# Patient Record
Sex: Male | Born: 1975
Health system: Southern US, Community
[De-identification: ages and names within clinical notes are randomized; demographics above are authoritative.]

## PROBLEM LIST (undated history)

## (undated) DIAGNOSIS — F419 Anxiety disorder, unspecified: Secondary | ICD-10-CM

## (undated) DIAGNOSIS — G5600 Carpal tunnel syndrome, unspecified upper limb: Secondary | ICD-10-CM

## (undated) DIAGNOSIS — F32A Depression, unspecified: Secondary | ICD-10-CM

## (undated) DIAGNOSIS — E785 Hyperlipidemia, unspecified: Secondary | ICD-10-CM

## (undated) DIAGNOSIS — G709 Myoneural disorder, unspecified: Secondary | ICD-10-CM

## (undated) DIAGNOSIS — N189 Chronic kidney disease, unspecified: Secondary | ICD-10-CM

## (undated) DIAGNOSIS — T7840XA Allergy, unspecified, initial encounter: Secondary | ICD-10-CM

## (undated) DIAGNOSIS — F329 Major depressive disorder, single episode, unspecified: Secondary | ICD-10-CM

## (undated) DIAGNOSIS — G629 Polyneuropathy, unspecified: Secondary | ICD-10-CM

## (undated) DIAGNOSIS — I8392 Asymptomatic varicose veins of left lower extremity: Secondary | ICD-10-CM

## (undated) HISTORY — DX: Anxiety disorder, unspecified: F41.9

## (undated) HISTORY — DX: Polyneuropathy, unspecified: G62.9

## (undated) HISTORY — DX: Myoneural disorder, unspecified: G70.9

## (undated) HISTORY — DX: Carpal tunnel syndrome, unspecified upper limb: G56.00

## (undated) HISTORY — DX: Hyperlipidemia, unspecified: E78.5

## (undated) HISTORY — DX: Asymptomatic varicose veins of left lower extremity: I83.92

## (undated) HISTORY — DX: Depression, unspecified: F32.A

## (undated) HISTORY — DX: Allergy, unspecified, initial encounter: T78.40XA

## (undated) HISTORY — DX: Major depressive disorder, single episode, unspecified: F32.9

---

## 1994-11-20 HISTORY — PX: LUMBAR DISC SURGERY: SHX700

## 1994-11-20 HISTORY — PX: BACK SURGERY: SHX140

## 1998-12-28 ENCOUNTER — Encounter: Payer: Self-pay | Admitting: Emergency Medicine

## 1998-12-28 ENCOUNTER — Emergency Department (HOSPITAL_COMMUNITY): Admission: EM | Admit: 1998-12-28 | Discharge: 1998-12-28 | Payer: Self-pay | Admitting: Emergency Medicine

## 2003-05-28 ENCOUNTER — Encounter: Payer: Self-pay | Admitting: Orthopedic Surgery

## 2003-05-28 ENCOUNTER — Ambulatory Visit (HOSPITAL_COMMUNITY): Admission: RE | Admit: 2003-05-28 | Discharge: 2003-05-28 | Payer: Self-pay | Admitting: Orthopedic Surgery

## 2012-06-25 ENCOUNTER — Ambulatory Visit (INDEPENDENT_AMBULATORY_CARE_PROVIDER_SITE_OTHER): Payer: PRIVATE HEALTH INSURANCE | Admitting: Family Medicine

## 2012-06-25 VITALS — BP 102/66 | HR 67 | Temp 98.0°F | Resp 16 | Ht 77.0 in | Wt 218.0 lb

## 2012-06-25 DIAGNOSIS — R079 Chest pain, unspecified: Secondary | ICD-10-CM

## 2012-06-25 NOTE — Patient Instructions (Addendum)
Chest Pain (Nonspecific) It is often hard to give a specific diagnosis for the cause of chest pain. There is always a chance that your pain could be related to something serious, such as a heart attack or a blood clot in the lungs. You need to follow up with your caregiver for further evaluation. CAUSES   Heartburn.   Pneumonia or bronchitis.   Anxiety or stress.   Inflammation around your heart (pericarditis) or lung (pleuritis or pleurisy).   A blood clot in the lung.   A collapsed lung (pneumothorax). It can develop suddenly on its own (spontaneous pneumothorax) or from injury (trauma) to the chest.   Shingles infection (herpes zoster virus).  The chest wall is composed of bones, muscles, and cartilage. Any of these can be the source of the pain.  The bones can be bruised by injury.   The muscles or cartilage can be strained by coughing or overwork.   The cartilage can be affected by inflammation and become sore (costochondritis).  DIAGNOSIS  Lab tests or other studies, such as X-rays, electrocardiography, stress testing, or cardiac imaging, may be needed to find the cause of your pain.  TREATMENT   Treatment depends on what may be causing your chest pain. Treatment may include:   Acid blockers for heartburn.   Anti-inflammatory medicine.   Pain medicine for inflammatory conditions.   Antibiotics if an infection is present.   You may be advised to change lifestyle habits. This includes stopping smoking and avoiding alcohol, caffeine, and chocolate.   You may be advised to keep your head raised (elevated) when sleeping. This reduces the chance of acid going backward from your stomach into your esophagus.   Most of the time, nonspecific chest pain will improve within 2 to 3 days with rest and mild pain medicine.  HOME CARE INSTRUCTIONS   If antibiotics were prescribed, take your antibiotics as directed. Finish them even if you start to feel better.   For the next few  days, avoid physical activities that bring on chest pain. Continue physical activities as directed.   Do not smoke.   Avoid drinking alcohol.   Only take over-the-counter or prescription medicine for pain, discomfort, or fever as directed by your caregiver.   Follow your caregiver's suggestions for further testing if your chest pain does not go away.   Keep any follow-up appointments you made. If you do not go to an appointment, you could develop lasting (chronic) problems with pain. If there is any problem keeping an appointment, you must call to reschedule.  SEEK MEDICAL CARE IF:   You think you are having problems from the medicine you are taking. Read your medicine instructions carefully.   Your chest pain does not go away, even after treatment.   You develop a rash with blisters on your chest.  SEEK IMMEDIATE MEDICAL CARE IF:   You have increased chest pain or pain that spreads to your arm, neck, jaw, back, or abdomen.   You develop shortness of breath, an increasing cough, or you are coughing up blood.   You have severe back or abdominal pain, feel nauseous, or vomit.   You develop severe weakness, fainting, or chills.   You have a fever.  THIS IS AN EMERGENCY. Do not wait to see if the pain will go away. Get medical help at once. Call your local emergency services (911 in [place] Do not drive yourself to the hospital. MAKE SURE YOU:   Understand these instructions.     Will watch your condition.   Will get help right away if you are not doing well or get worse.  Document Released: 08/16/2005 Document Revised: 10/26/2011 Document Reviewed: 06/11/2008 ExitCare Patient Information 2012 [employer]. 

## 2012-06-25 NOTE — Progress Notes (Signed)
  Subjective:    Patient ID: Mark Morton, male    DOB: 04-Apr-1976, 36 y.o.   MRN: [id number]  HPI Comments: While bending over drilling screws into a piece of wood he developed chest tightness radiating "all the way across" his chest.  He stood up and within 1 minute it resolved. It was significant enough to cause him to stop for a while.  He has not had CP prior to or since that episode.  No SOB, nausea or reflux.  No coughing.  No FMHX of CVD.  Episode occurred before lunch.    Chest Pain  Pertinent negatives include no dizziness, palpitations or shortness of breath.      Review of Systems  Constitutional: Negative.   Respiratory: Positive for chest tightness. Negative for shortness of breath.   Cardiovascular: Positive for chest pain. Negative for palpitations.  Musculoskeletal: Negative.   Neurological: Negative.  Negative for dizziness.       Objective:   Physical Exam  Constitutional: He is oriented to person, place, and time. He appears well-developed and well-nourished.  HENT:  Head: Normocephalic.  Cardiovascular: Normal rate, regular rhythm and intact distal pulses.  Exam reveals no gallop and no friction rub.   No murmur heard. Pulmonary/Chest: Effort normal and breath sounds normal. He has no wheezes. He has no rales.       Very mild chest wall TTP in left upper pectoralis region.  Neurological: He is alert and oriented to person, place, and time. He exhibits normal muscle tone. Coordination normal.  Skin: Skin is warm and dry.  Psychiatric: He has a normal mood and affect. His behavior is normal. Judgment and thought content normal.    ECG:      Assessment & Plan:  Atypical Chest Pain  Suspect atypical muscle spasm  Reassurrance given  F/U prn  Recommend CPE at some point in near future.

## 2012-10-23 ENCOUNTER — Ambulatory Visit (INDEPENDENT_AMBULATORY_CARE_PROVIDER_SITE_OTHER): Payer: PRIVATE HEALTH INSURANCE | Admitting: Family Medicine

## 2012-10-23 ENCOUNTER — Ambulatory Visit: Payer: PRIVATE HEALTH INSURANCE

## 2012-10-23 VITALS — BP 122/74 | HR 70 | Temp 97.4°F | Resp 18 | Ht 76.5 in | Wt 219.0 lb

## 2012-10-23 DIAGNOSIS — Z0000 Encounter for general adult medical examination without abnormal findings: Secondary | ICD-10-CM

## 2012-10-23 DIAGNOSIS — M7712 Lateral epicondylitis, left elbow: Secondary | ICD-10-CM

## 2012-10-23 DIAGNOSIS — M25572 Pain in left ankle and joints of left foot: Secondary | ICD-10-CM

## 2012-10-23 DIAGNOSIS — M25579 Pain in unspecified ankle and joints of unspecified foot: Secondary | ICD-10-CM

## 2012-10-23 DIAGNOSIS — M7710 Lateral epicondylitis, unspecified elbow: Secondary | ICD-10-CM

## 2012-10-23 LAB — POCT CBC
Granulocyte percent: 57 %G (ref 37–80)
HCT, POC: 47.4 % (ref 43.5–53.7)
Hemoglobin: 15.3 g/dL (ref 14.1–18.1)
Lymph, poc: 2.4 (ref 0.6–3.4)
MCH, POC: 31.6 pg — AB (ref 27–31.2)
MCHC: 32.3 g/dL (ref 31.8–35.4)
MCV: 97.9 fL — AB (ref 80–97)
MID (cbc): 0.4 (ref 0–0.9)
MPV: 10 fL (ref 0–99.8)
POC Granulocyte: 3.8 (ref 2–6.9)
POC LYMPH PERCENT: 36.8 %L (ref 10–50)
POC MID %: 6.2 %M (ref 0–12)
Platelet Count, POC: 291 10*3/uL (ref 142–424)
RBC: 4.84 M/uL (ref 4.69–6.13)
RDW, POC: 13.3 %
WBC: 6.6 10*3/uL (ref 4.6–10.2)

## 2012-10-23 LAB — POCT UA - MICROSCOPIC ONLY
Bacteria, U Microscopic: NEGATIVE
Casts, Ur, LPF, POC: NEGATIVE
Crystals, Ur, HPF, POC: NEGATIVE
Epithelial cells, urine per micros: NEGATIVE
Mucus, UA: NEGATIVE
RBC, urine, microscopic: NEGATIVE
WBC, Ur, HPF, POC: NEGATIVE
Yeast, UA: NEGATIVE

## 2012-10-23 LAB — POCT URINALYSIS DIPSTICK
Bilirubin, UA: NEGATIVE
Blood, UA: NEGATIVE
Glucose, UA: NEGATIVE
Ketones, UA: NEGATIVE
Leukocytes, UA: NEGATIVE
Nitrite, UA: NEGATIVE
Protein, UA: NEGATIVE
Spec Grav, UA: 1.025
Urobilinogen, UA: 0.2
pH, UA: 5.5

## 2012-10-23 MED ORDER — PREDNISONE 20 MG PO TABS: 40.0000 mg | ORAL_TABLET | Freq: Every day | ORAL | Status: DC

## 2012-10-23 NOTE — Progress Notes (Signed)
@[email or web address] @  Patient ID: Mark Morton MRN: [id number], DOB: 1976/03/10 36 y.o. Date of Encounter: 10/23/2012, 9:00 PM  Primary Physician: No primary provider on file.  Chief Complaint: Physical (CPE)  HPI: 36 y.o. y/o male with history noted below here for CPE.  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 He has several complaints: 1 left arm pain for 2 weeks over the brachial radialis. This began after playing vigorously on the car for extended length of time for his church choir. 2 patient has a number of moles on his back his girlfriend told him he needs that looked at 3 patient has 2 years of left ankle pain following a basketball game. He stayed off basketball games for 2 years but over the last 6 weeks he started playing again and he notices acute sharp pain intermittently when he is running back and forth  Review of Systems: Consitutional: No fever, chills, fatigue, night sweats, lymphadenopathy, or weight changes. Eyes: No visual changes, eye redness, or discharge. ENT/Mouth: Ears: No otalgia, tinnitus, hearing loss, discharge. Nose: No congestion, rhinorrhea, sinus pain, or epistaxis. Throat: No sore throat, post nasal drip, or teeth pain. Cardiovascular: No CP, palpitations, diaphoresis, DOE, edema, orthopnea, PND. Respiratory: No cough, hemoptysis, SOB, or wheezing. Gastrointestinal: No anorexia, dysphagia, reflux, pain, nausea, vomiting, hematemesis, diarrhea, constipation, BRBPR, or melena. Genitourinary: No dysuria, frequency, urgency, hematuria, incontinence, nocturia, decreased urinary stream, discharge, impotence, or testicular pain/masses. Musculoskeletal: No decreased ROM, myalgias, stiffness, joint swelling, or weakness. Skin: No rash, erythema, lesion changes, pain, warmth, jaundice, or pruritis. Neurological: No headache, dizziness, syncope, seizures, tremors, memory loss, coordination problems, or paresthesias. Psychological: No anxiety, depression, hallucinations,  SI/HI. Endocrine: No fatigue, polydipsia, polyphagia, polyuria, or known diabetes. All other systems were reviewed and are otherwise negative.  Past Medical History  Diagnosis Date  . Allergy   . Anxiety   . Depression      History reviewed. No pertinent past surgical history.  Home Meds:  Prior to Admission medications   Not on File    Allergies: No Known Allergies  History   Social History  . Marital Status: Single    Spouse Name: N/A    Number of Children: N/A  . Years of Education: N/A   Occupational History  . Not on file.   Social History Main Topics  . Smoking status: Never Smoker   . Smokeless tobacco: Not on file  . Alcohol Use: 2.4 oz/week    4 Cans of beer per week  . Drug Use: No  . Sexually Active: No   Other Topics Concern  . Not on file   Social History Narrative  . No narrative on file    Family History  Problem Relation Age of Onset  . Cancer Maternal Grandmother   . Diabetes Maternal Grandfather     Physical Exam: Blood pressure 122/74, pulse 70, temperature 97.4 F (36.3 C), resp. rate 18, height 6' 4.5" (1.943 m), weight 219 lb (99.338 kg), SpO2 97.00%.  General: Well developed, well nourished, in no acute distress. HEENT: Normocephalic, atraumatic. Conjunctiva pink, sclera non-icteric. Pupils 2 mm constricting to 1 mm, round, regular, and equally reactive to light and accomodation. EOMI. Internal auditory canal clear. TMs with good cone of light and without pathology. Nasal mucosa pink. Nares are without discharge. No sinus tenderness. Oral mucosa pink. Dentition mild gingivitis. Pharynx without exudate.   Neck: Supple. Trachea midline. No thyromegaly. Full ROM. No lymphadenopathy. Lungs: Clear to auscultation bilaterally without wheezes, rales, or  rhonchi. Breathing is of normal effort and unlabored. Cardiovascular: RRR with S1 S2. No murmurs, rubs, or gallops appreciated. Distal pulses 2+ symmetrically. No carotid or abdominal  bruits Abdomen: Soft, non-tender, non-distended with normoactive bowel sounds. No hepatosplenomegaly or masses. No rebound/guarding. No CVA tenderness. Without hernias.  Rectal: No external hemorrhoids or fissures.   Genitourinary:  uncircumcised male. No penile lesions. Testes descended bilaterally, and smooth without tenderness or masses.  Musculoskeletal: Full range of motion and 5/5 strength throughout. Without swelling, atrophy, tenderness, crepitus, or warmth. Extremities without clubbing, cyanosis, or edema. Calves supple. Examination of the left brachial radialis reveals mild tenderness, no swelling, pain with full extension. He has normal hand and wrist movement and negative Tinel's Examination of left ankle reveals normal range of motion, normal palpation, normal inspection, normal ligamentous testing including anterior drawer. Skin: Warm and moist without erythema, ecchymosis, wounds, or rash. Dermlite exam reveals multiple seborrheic keratosis on his back Neuro: A+Ox3. CN II-XII grossly intact. Moves all extremities spontaneously. Full sensation throughout. Normal gait. DTR 2+ throughout upper and lower extremities. Finger to nose intact. Psych:  Responds to questions appropriately with a normal affect.   [facility] reading (PRIMARY) by  Dr. Milus Glazier:  Moderate calcific changes in medial deltoid ligament of left ankle Results for orders placed in visit on 10/23/12  POCT CBC      Component Value Range   WBC 6.6  4.6 - 10.2 K/uL   Lymph, poc 2.4  0.6 - 3.4   POC LYMPH PERCENT 36.8  10 - 50 %L   MID (cbc) 0.4  0 - 0.9   POC MID % 6.2  0 - 12 %M   POC Granulocyte 3.8  2 - 6.9   Granulocyte percent 57.0  37 - 80 %G   RBC 4.84  4.69 - 6.13 M/uL   Hemoglobin 15.3  14.1 - 18.1 g/dL   HCT, POC 16.1  09.6 - 53.7 %   MCV 97.9 (*) 80 - 97 fL   MCH, POC 31.6 (*) 27 - 31.2 pg   MCHC 32.3  31.8 - 35.4 g/dL   RDW, POC 04.5     Platelet Count, POC 291  142 - 424 K/uL   MPV 10.0  0 - 99.8 fL   POCT URINALYSIS DIPSTICK      Component Value Range   Color, UA yellow     Clarity, UA clear     Glucose, UA neg     Bilirubin, UA neg     Ketones, UA neg     Spec Grav, UA 1.025     Blood, UA neg     pH, UA 5.5     Protein, UA neg     Urobilinogen, UA 0.2     Nitrite, UA neg     Leukocytes, UA Negative    POCT UA - MICROSCOPIC ONLY      Component Value Range   WBC, Ur, HPF, POC neg     RBC, urine, microscopic neg     Bacteria, U Microscopic neg     Mucus, UA neg     Epithelial cells, urine per micros neg     Crystals, Ur, HPF, POC neg     Casts, Ur, LPF, POC neg     Yeast, UA neg     .    Assessment/Plan: I showed patient his x-rays of his ankle and point out that he does have arthritis following his injury 2 years ago but that he should allow  him to do normal activities  of daily life but that if he wants to do basketball or football he'll need to see an orthopedic surgeon. Given a copy of his x-rays. I discussed his tennis elbow. I also discussed importance of getting good dental hygiene and getting on a dental plan.  36 y.o. y/o  male here for CPE 1. Annual physical exam  POCT CBC, Comprehensive metabolic panel, Lipid panel, POCT urinalysis dipstick, POCT UA - Microscopic Only, Sedimentation Rate  2. Left ankle pain  DG Ankle Complete Left, Sedimentation Rate  3. Left tennis elbow  predniSONE (DELTASONE) 20 MG tablet   -  Signed, Elvina Sidle, MD 10/23/2012 9:00 PM

## 2012-10-23 NOTE — Patient Instructions (Addendum)
Health Maintenance, Males A healthy lifestyle and preventative care can promote health and wellness.  Maintain regular health, dental, and eye exams.  Eat a healthy diet. Foods like vegetables, fruits, whole grains, low-fat dairy products, and lean protein foods contain the nutrients you need without too many calories. Decrease your intake of foods high in solid fats, added sugars, and salt. Get information about a proper diet from your caregiver, if necessary.  Regular physical exercise is one of the most important things you can do for your health. Most adults should get at least 150 minutes of moderate-intensity exercise (any activity that increases your heart rate and causes you to sweat) each week. In addition, most adults need muscle-strengthening exercises on 2 or more days a week.   Maintain a healthy weight. The body mass index (BMI) is a screening tool to identify possible weight problems. It provides an estimate of body fat based on height and weight. Your caregiver can help determine your BMI, and can help you achieve or maintain a healthy weight. For adults 20 years and older:  A BMI below 18.5 is considered underweight.  A BMI of 18.5 to 24.9 is normal.  A BMI of 25 to 29.9 is considered overweight.  A BMI of 30 and above is considered obese.  Maintain normal blood lipids and cholesterol by exercising and minimizing your intake of saturated fat. Eat a balanced diet with plenty of fruits and vegetables. Blood tests for lipids and cholesterol should begin at age 20 and be repeated every 5 years. If your lipid or cholesterol levels are high, you are over 50, or you are a high risk for heart disease, you may need your cholesterol levels checked more frequently.Ongoing high lipid and cholesterol levels should be treated with medicines, if diet and exercise are not effective.  If you smoke, find out from your caregiver how to quit. If you do not use tobacco, do not start.  If you  choose to drink alcohol, do not exceed 2 drinks per day. One drink is considered to be 12 ounces (355 mL) of beer, 5 ounces (148 mL) of wine, or 1.5 ounces (44 mL) of liquor.  Avoid use of street drugs. Do not share needles with anyone. Ask for help if you need support or instructions about stopping the use of drugs.  High blood pressure causes heart disease and increases the risk of stroke. Blood pressure should be checked at least every 1 to 2 years. Ongoing high blood pressure should be treated with medicines if weight loss and exercise are not effective.  If you are 45 to 38 years old, ask your caregiver if you should take aspirin to prevent heart disease.  Diabetes screening involves taking a blood sample to check your fasting blood sugar level. This should be done once every 3 years, after age 45, if you are within normal weight and without risk factors for diabetes. Testing should be considered at a younger age or be carried out more frequently if you are overweight and have at least 1 risk factor for diabetes.  Colorectal cancer can be detected and often prevented. Most routine colorectal cancer screening begins at the age of 50 and continues through age 75. However, your caregiver may recommend screening at an earlier age if you have risk factors for colon cancer. On a yearly basis, your caregiver may provide home test kits to check for hidden blood in the stool. Use of a small camera at the end of a tube,   to directly examine the colon (sigmoidoscopy or colonoscopy), can detect the earliest forms of colorectal cancer. Talk to your caregiver about this at age 50, when routine screening begins. Direct examination of the colon should be repeated every 5 to 10 years through age 75, unless early forms of pre-cancerous polyps or small growths are found.  Hepatitis C blood testing is recommended for all people born from 1945 through 1965 and any individual with known risks for hepatitis C.  Healthy  men should no longer receive prostate-specific antigen (PSA) blood tests as part of routine cancer screening. Consult with your caregiver about prostate cancer screening.  Testicular cancer screening is not recommended for adolescents or adult males who have no symptoms. Screening includes self-exam, caregiver exam, and other screening tests. Consult with your caregiver about any symptoms you have or any concerns you have about testicular cancer.  Practice safe sex. Use condoms and avoid high-risk sexual practices to reduce the spread of sexually transmitted infections (STIs).  Use sunscreen with a sun protection factor (SPF) of 30 or greater. Apply sunscreen liberally and repeatedly throughout the day. You should seek shade when your shadow is shorter than you. Protect yourself by wearing long sleeves, pants, a wide-brimmed hat, and sunglasses year round, whenever you are outdoors.  Notify your caregiver of new moles or changes in moles, especially if there is a change in shape or color. Also notify your caregiver if a mole is larger than the size of a pencil eraser.  A one-time screening for abdominal aortic aneurysm (AAA) and surgical repair of large AAAs by sound wave imaging (ultrasonography) is recommended for ages 65 to 75 years who are current or former smokers.  Stay current with your immunizations. Document Released: 05/04/2008 Document Revised: 01/29/2012 Document Reviewed: 04/03/2011 Exit[employer] Patient Information 2013 [employer].  

## 2012-10-24 LAB — COMPREHENSIVE METABOLIC PANEL
ALT: 28 U/L (ref 0–53)
AST: 22 U/L (ref 0–37)
Albumin: 4.6 g/dL (ref 3.5–5.2)
Alkaline Phosphatase: 66 U/L (ref 39–117)
BUN: 24 mg/dL — ABNORMAL HIGH (ref 6–23)
CO2: 27 mEq/L (ref 19–32)
Calcium: 9.5 mg/dL (ref 8.4–10.5)
Chloride: 101 mEq/L (ref 96–112)
Creat: 1.15 mg/dL (ref 0.50–1.35)
Glucose, Bld: 98 mg/dL (ref 70–99)
Potassium: 4.7 mEq/L (ref 3.5–5.3)
Sodium: 138 mEq/L (ref 135–145)
Total Bilirubin: 0.5 mg/dL (ref 0.3–1.2)
Total Protein: 6.9 g/dL (ref 6.0–8.3)

## 2012-10-24 LAB — LIPID PANEL
Cholesterol: 219 mg/dL — ABNORMAL HIGH (ref 0–200)
HDL: 26 mg/dL — ABNORMAL LOW (ref >39)
Total CHOL/HDL Ratio: 8.4 Ratio
Triglycerides: 658 mg/dL — ABNORMAL HIGH (ref <150)

## 2012-10-24 LAB — SEDIMENTATION RATE: Sed Rate: 1 mm/hr (ref 0–16)

## 2012-10-25 ENCOUNTER — Encounter: Payer: Self-pay | Admitting: *Deleted

## 2012-11-01 ENCOUNTER — Telehealth: Payer: Self-pay

## 2012-11-01 NOTE — Telephone Encounter (Signed)
Normal lab letter was sent. However patients triglycerides are 658. Please advise. He is asking about this. Jeanett Antonopoulos

## 2012-11-01 NOTE — Telephone Encounter (Signed)
The patient called to request that a clinical staff member call him to discuss the lab results that he received in the mail.  Please call the patient at [phone].

## 2012-11-01 NOTE — Telephone Encounter (Signed)
Dr. Milus Glazier please address patient's concerns.

## 2012-11-03 NOTE — Telephone Encounter (Signed)
There are medicines for this, but the level is not high enough to panic.  The best approach is weight loss and exercise.  If he cannot do this,  Then we can prescribe a pill.

## 2012-11-04 NOTE — Telephone Encounter (Signed)
Spoke with patient and gave him Dr. Loma Boston message.  Patient states understanding.

## 2013-06-21 ENCOUNTER — Ambulatory Visit (INDEPENDENT_AMBULATORY_CARE_PROVIDER_SITE_OTHER): Payer: BC Managed Care – PPO | Admitting: Family Medicine

## 2013-06-21 ENCOUNTER — Ambulatory Visit: Payer: BC Managed Care – PPO

## 2013-06-21 VITALS — BP 110/61 | HR 58 | Temp 97.9°F | Resp 16 | Ht 76.0 in | Wt 214.0 lb

## 2013-06-21 DIAGNOSIS — J3489 Other specified disorders of nose and nasal sinuses: Secondary | ICD-10-CM

## 2013-06-21 DIAGNOSIS — N529 Male erectile dysfunction, unspecified: Secondary | ICD-10-CM

## 2013-06-21 DIAGNOSIS — R0981 Nasal congestion: Secondary | ICD-10-CM

## 2013-06-21 MED ORDER — FLUTICASONE PROPIONATE 50 MCG/ACT NA SUSP: 2.0000 | Freq: Every day | NASAL | Status: DC

## 2013-06-21 NOTE — Progress Notes (Signed)
[facility]:  Office Visit  Chief Complaint:  Chief Complaint  Patient presents with  . Nasal Congestion    "couple years"  . testosterone check    HPI: Mark Morton is a 37 y.o. male who complains of : 1. Wants a testosterone level check, libido is ok, maintaining an erection is a problem, he recently got married. He also has an issue of varicose veins on his left leg and thought this may be a contributing factor.  2. Nasal congestion for many years and is all year long, he has not really trieed anything for it, does not want to be on chronic meds. He does not know if it gets better when he leaves [place].   Past Medical History  Diagnosis Date  . Allergy   . Anxiety   . Depression    History reviewed. No pertinent past surgical history. History   Social History  . Marital Status: Married    Spouse Name: N/A    Number of Children: N/A  . Years of Education: N/A   Social History Main Topics  . Smoking status: Never Smoker   . Smokeless tobacco: None  . Alcohol Use: 2.4 oz/week    4 Cans of beer per week  . Drug Use: No  . Sexually Active: No   Other Topics Concern  . None   Social History Narrative  . None   Family History  Problem Relation Age of Onset  . Cancer Maternal Grandmother   . Diabetes Maternal Grandfather    No Known Allergies Prior to Admission medications   Medication Sig Start Date End Date Taking? Authorizing Provider  predniSONE (DELTASONE) 20 MG tablet Take 2 tablets (40 mg total) by mouth daily. 10/23/12   Elvina Sidle, MD     ROS: The patient denies fevers, chills, night sweats, unintentional weight loss, chest pain, palpitations, wheezing, dyspnea on exertion, nausea, vomiting, abdominal pain, dysuria, hematuria, melena, numbness, weakness, or tingling.  All other systems have been reviewed and were otherwise negative with the exception of those mentioned in the HPI and as above.    PHYSICAL EXAM: Filed Vitals:   06/21/13 1549  BP: 110/61  Pulse: 58  Temp: 97.9 F (36.6 C)  Resp: 16   Filed Vitals:   06/21/13 1549  Height: 6\' 4"  (1.93 m)  Weight: 214 lb (97.07 kg)   Body mass index is 26.06 kg/(m^2).  General: Alert, no acute distress HEENT:  Normocephalic, atraumatic, oropharynx patent.  Cardiovascular:  Regular rate and rhythm, no rubs murmurs or gallops.  No Carotid bruits, radial pulse intact. No pedal edema.  Respiratory: Clear to auscultation bilaterally.  No wheezes, rales, or rhonchi.  No cyanosis, no use of accessory musculature GI: No organomegaly, abdomen is soft and non-tender, positive bowel sounds.  No masses. Skin: No rashes. Neurologic: Facial musculature symmetric. Psychiatric: Patient is appropriate throughout our interaction. Lymphatic: No cervical lymphadenopathy Musculoskeletal: Gait intact.   LABS: Results for orders placed in visit on 10/23/12  COMPREHENSIVE METABOLIC PANEL      Result Value Range   Sodium 138  135 - 145 mEq/L   Potassium 4.7  3.5 - 5.3 mEq/L   Chloride 101  96 - 112 mEq/L   CO2 27  19 - 32 mEq/L   Glucose, Bld 98  70 - 99 mg/dL   BUN 24 (*) 6 - 23 mg/dL   Creat 1.61  0.96 - 0.45 mg/dL   Total Bilirubin 0.5  0.3 - 1.2  mg/dL   Alkaline Phosphatase 66  39 - 117 U/L   AST 22  0 - 37 U/L   ALT 28  0 - 53 U/L   Total Protein 6.9  6.0 - 8.3 g/dL   Albumin 4.6  3.5 - 5.2 g/dL   Calcium 9.5  8.4 - 16.1 mg/dL  LIPID PANEL      Result Value Range   Cholesterol 219 (*) 0 - 200 mg/dL   Triglycerides 096 (*) <150 mg/dL   HDL 26 (*) >04 mg/dL   Total CHOL/HDL Ratio 8.4     VLDL NOT CALC  0 - 40 mg/dL   LDL Cholesterol      SEDIMENTATION RATE      Result Value Range   Sed Rate 1  0 - 16 mm/hr  POCT CBC      Result Value Range   WBC 6.6  4.6 - 10.2 K/uL   Lymph, poc 2.4  0.6 - 3.4   POC LYMPH PERCENT 36.8  10 - 50 %L   MID (cbc) 0.4  0 - 0.9   POC MID % 6.2  0 - 12 %M   POC Granulocyte 3.8  2 - 6.9   Granulocyte percent 57.0  37 - 80 %G    RBC 4.84  4.69 - 6.13 M/uL   Hemoglobin 15.3  14.1 - 18.1 g/dL   HCT, POC 54.0  98.1 - 53.7 %   MCV 97.9 (*) 80 - 97 fL   MCH, POC 31.6 (*) 27 - 31.2 pg   MCHC 32.3  31.8 - 35.4 g/dL   RDW, POC 19.1     Platelet Count, POC 291  142 - 424 K/uL   MPV 10.0  0 - 99.8 fL  POCT URINALYSIS DIPSTICK      Result Value Range   Color, UA yellow     Clarity, UA clear     Glucose, UA neg     Bilirubin, UA neg     Ketones, UA neg     Spec Grav, UA 1.025     Blood, UA neg     pH, UA 5.5     Protein, UA neg     Urobilinogen, UA 0.2     Nitrite, UA neg     Leukocytes, UA Negative    POCT UA - MICROSCOPIC ONLY      Result Value Range   WBC, Ur, HPF, POC neg     RBC, urine, microscopic neg     Bacteria, U Microscopic neg     Mucus, UA neg     Epithelial cells, urine per micros neg     Crystals, Ur, HPF, POC neg     Casts, Ur, LPF, POC neg     Yeast, UA neg       EKG/XRAY:   Primary read interpreted by Dr. Conley Rolls at [facility]. ? Deviated septum Mild sinus congestion   ASSESSMENT/PLAN: Encounter Diagnoses  Name Primary?  . Nasal congestion Yes  . Erectile dysfunction    Allergy realted nasal congested, ? deviated Rx flonase Advise of use ot antihistamine daily F/u in AM for TSH, testosterone free and total F/u prn   Mark Gumbs PHUONG, DO 06/21/2013 5:30 PM

## 2013-06-22 ENCOUNTER — Other Ambulatory Visit (INDEPENDENT_AMBULATORY_CARE_PROVIDER_SITE_OTHER): Payer: BC Managed Care – PPO | Admitting: Family Medicine

## 2013-06-22 DIAGNOSIS — N529 Male erectile dysfunction, unspecified: Secondary | ICD-10-CM

## 2013-06-22 LAB — TSH: TSH: 1.506 u[IU]/mL (ref 0.350–4.500)

## 2013-06-23 LAB — TESTOSTERONE, FREE, TOTAL, SHBG
Sex Hormone Binding: 22 nmol/L (ref 13–71)
Testosterone, Free: 100.6 pg/mL (ref 47.0–244.0)
Testosterone-% Free: 2.5 % (ref 1.6–2.9)
Testosterone: 400 ng/dL (ref 300–890)

## 2013-06-25 ENCOUNTER — Encounter: Payer: Self-pay | Admitting: Family Medicine

## 2014-04-24 ENCOUNTER — Ambulatory Visit: Payer: BC Managed Care – PPO

## 2015-06-16 ENCOUNTER — Emergency Department (HOSPITAL_BASED_OUTPATIENT_CLINIC_OR_DEPARTMENT_OTHER): Payer: BLUE CROSS/BLUE SHIELD

## 2015-06-16 ENCOUNTER — Encounter (HOSPITAL_BASED_OUTPATIENT_CLINIC_OR_DEPARTMENT_OTHER): Payer: Self-pay | Admitting: Emergency Medicine

## 2015-06-16 ENCOUNTER — Emergency Department (HOSPITAL_BASED_OUTPATIENT_CLINIC_OR_DEPARTMENT_OTHER)
Admission: EM | Admit: 2015-06-16 | Discharge: 2015-06-16 | Disposition: A | Discharge status: Home / Self Care | Payer: BLUE CROSS/BLUE SHIELD | Attending: Emergency Medicine | Admitting: Emergency Medicine

## 2015-06-16 DIAGNOSIS — N23 Unspecified renal colic: Secondary | ICD-10-CM

## 2015-06-16 DIAGNOSIS — Z8659 Personal history of other mental and behavioral disorders: Secondary | ICD-10-CM | POA: Diagnosis not present

## 2015-06-16 DIAGNOSIS — Z7952 Long term (current) use of systemic steroids: Secondary | ICD-10-CM | POA: Insufficient documentation

## 2015-06-16 DIAGNOSIS — R109 Unspecified abdominal pain: Secondary | ICD-10-CM | POA: Diagnosis present

## 2015-06-16 DIAGNOSIS — Z7951 Long term (current) use of inhaled steroids: Secondary | ICD-10-CM | POA: Insufficient documentation

## 2015-06-16 LAB — URINALYSIS, ROUTINE W REFLEX MICROSCOPIC
GLUCOSE, UA: NEGATIVE mg/dL
KETONES UR: 15 mg/dL — AB
Leukocytes, UA: NEGATIVE
NITRITE: NEGATIVE
Protein, ur: NEGATIVE mg/dL
SPECIFIC GRAVITY, URINE: 1.03 (ref 1.005–1.030)
Urobilinogen, UA: 1 mg/dL (ref 0.0–1.0)
pH: 5 (ref 5.0–8.0)

## 2015-06-16 LAB — URINE MICROSCOPIC-ADD ON

## 2015-06-16 LAB — CBC WITH DIFFERENTIAL/PLATELET
BASOS ABS: 0 10*3/uL (ref 0.0–0.1)
Basophils Relative: 0 % (ref 0–1)
EOS PCT: 0 % (ref 0–5)
Eosinophils Absolute: 0 10*3/uL (ref 0.0–0.7)
HEMATOCRIT: 41.8 % (ref 39.0–52.0)
HEMOGLOBIN: 14.3 g/dL (ref 13.0–17.0)
LYMPHS PCT: 9 % — AB (ref 12–46)
Lymphs Abs: 0.8 10*3/uL (ref 0.7–4.0)
MCH: 31.6 pg (ref 26.0–34.0)
MCHC: 34.2 g/dL (ref 30.0–36.0)
MCV: 92.5 fL (ref 78.0–100.0)
MONOS PCT: 5 % (ref 3–12)
Monocytes Absolute: 0.5 10*3/uL (ref 0.1–1.0)
NEUTROS ABS: 7.9 10*3/uL — AB (ref 1.7–7.7)
NEUTROS PCT: 86 % — AB (ref 43–77)
Platelets: 215 10*3/uL (ref 150–400)
RBC: 4.52 MIL/uL (ref 4.22–5.81)
RDW: 13.4 % (ref 11.5–15.5)
WBC: 9.2 10*3/uL (ref 4.0–10.5)

## 2015-06-16 LAB — BASIC METABOLIC PANEL
Anion gap: 7 (ref 5–15)
BUN: 29 mg/dL — ABNORMAL HIGH (ref 6–20)
CO2: 27 mmol/L (ref 22–32)
Calcium: 9.3 mg/dL (ref 8.9–10.3)
Chloride: 101 mmol/L (ref 101–111)
Creatinine, Ser: 1.37 mg/dL — ABNORMAL HIGH (ref 0.61–1.24)
Glucose, Bld: 116 mg/dL — ABNORMAL HIGH (ref 65–99)
Potassium: 3.6 mmol/L (ref 3.5–5.1)
Sodium: 135 mmol/L (ref 135–145)

## 2015-06-16 MED ORDER — TAMSULOSIN HCL 0.4 MG PO CAPS: 0.4000 mg | ORAL_CAPSULE | Freq: Every day | ORAL | Status: DC

## 2015-06-16 MED ORDER — HYDROMORPHONE HCL 1 MG/ML IJ SOLN
1.0000 mg | Freq: Once | INTRAMUSCULAR | Status: AC
  Administered 2015-06-16: 1 mg via INTRAVENOUS
  Filled 2015-06-16: qty 1

## 2015-06-16 MED ORDER — OXYCODONE-ACETAMINOPHEN 5-325 MG PO TABS: 1.0000 | ORAL_TABLET | Freq: Four times a day (QID) | ORAL | Status: DC | PRN

## 2015-06-16 NOTE — Discharge Instructions (Signed)
You have a six millimeter kidney stone on the left side that is causing your pain.  You have a second stone in your left kidney that is not causing any pain currently.  Follow up with Urology and your Family doctor.  Your kidney function should be rechecked in the next 1-2 weeks.  Drink plenty of fluids.  Get rechecked immediately if you develop fevers, unable to urinate, or uncontrolled pain.      Kidney Stones Kidney stones (urolithiasis) are deposits that form inside your kidneys. The intense pain is caused by the stone moving through the urinary tract. When the stone moves, the ureter goes into spasm around the stone. The stone is usually passed in the urine.  CAUSES   A disorder that makes certain neck glands produce too much parathyroid hormone (primary hyperparathyroidism).  A buildup of uric acid crystals, similar to gout in your joints.  Narrowing (stricture) of the ureter.  A kidney obstruction present at birth (congenital obstruction).  Previous surgery on the kidney or ureters.  Numerous kidney infections. SYMPTOMS   Feeling sick to your stomach (nauseous).  Throwing up (vomiting).  Blood in the urine (hematuria).  Pain that usually spreads (radiates) to the groin.  Frequency or urgency of urination. DIAGNOSIS   Taking a history and physical exam.  Blood or urine tests.  CT scan.  Occasionally, an examination of the inside of the urinary bladder (cystoscopy) is performed. TREATMENT   Observation.  Increasing your fluid intake.  Extracorporeal shock wave lithotripsy--This is a noninvasive procedure that uses shock waves to break up kidney stones.  Surgery may be needed if you have severe pain or persistent obstruction. There are various surgical procedures. Most of the procedures are performed with the use of small instruments. Only small incisions are needed to accommodate these instruments, so recovery time is minimized. The size, location, and chemical  composition are all important variables that will determine the proper choice of action for you. Talk to your health care provider to better understand your situation so that you will minimize the risk of injury to yourself and your kidney.  HOME CARE INSTRUCTIONS   Drink enough water and fluids to keep your urine clear or pale yellow. This will help you to pass the stone or stone fragments.  Strain all urine through the provided strainer. Keep all particulate matter and stones for your health care provider to see. The stone causing the pain may be as small as a grain of salt. It is very important to use the strainer each and every time you pass your urine. The collection of your stone will allow your health care provider to analyze it and verify that a stone has actually passed. The stone analysis will often identify what you can do to reduce the incidence of recurrences.  Only take over-the-counter or prescription medicines for pain, discomfort, or fever as directed by your health care provider.  Make a follow-up appointment with your health care provider as directed.  Get follow-up X-rays if required. The absence of pain does not always mean that the stone has passed. It may have only stopped moving. If the urine remains completely obstructed, it can cause loss of kidney function or even complete destruction of the kidney. It is your responsibility to make sure X-rays and follow-ups are completed. Ultrasounds of the kidney can show blockages and the status of the kidney. Ultrasounds are not associated with any radiation and can be performed easily in a matter of minutes.  SEEK MEDICAL CARE IF:  You experience pain that is progressive and unresponsive to any pain medicine you have been prescribed. SEEK IMMEDIATE MEDICAL CARE IF:   Pain cannot be controlled with the prescribed medicine.  You have a fever or shaking chills.  The severity or intensity of pain increases over 18 hours and is not  relieved by pain medicine.  You develop a new onset of abdominal pain.  You feel faint or pass out.  You are unable to urinate. MAKE SURE YOU:   Understand these instructions.  Will watch your condition.  Will get help right away if you are not doing well or get worse. Document Released: 11/06/2005 Document Revised: 07/09/2013 Document Reviewed: 04/09/2013 [facility] Patient Information 2015 [place], [place]. This information is not intended to replace advice given to you by your health care provider. Make sure you discuss any questions you have with your health care provider.

## 2015-06-16 NOTE — ED Provider Notes (Signed)
CSN: [id number]     Arrival date & time 06/16/15  1547 History   First MD Initiated Contact with Patient 06/16/15 1555     Chief Complaint  Patient presents with  . Flank Pain     Patient is a 39 y.o. male presenting with flank pain. The history is provided by the patient. No language interpreter was used.  Flank Pain   Mark Morton presents for evaluation of left flank pain. Symptoms started around 12:30 today. Symptoms occurred at rest. He states it feels like a spasm in his left back. Symptoms started suddenly and they're constant nature. He's tried Robaxin at home, positioning without any relief. He denies any injuries. He has nausea but no vomiting, fevers, dysuria, abdominal pain. He saw his family doctor today and was given Toradol and Phenergan IM with improvement in symptoms. No similar symptoms previously.  Past Medical History  Diagnosis Date  . Allergy   . Anxiety   . Depression    Past Surgical History  Procedure Laterality Date  . Lumbar disc surgery     Family History  Problem Relation Age of Onset  . Cancer Maternal Grandmother   . Diabetes Maternal Grandfather    History  Substance Use Topics  . Smoking status: Never Smoker   . Smokeless tobacco: Not on file  . Alcohol Use: Yes     Comment: occ    Review of Systems  Genitourinary: Positive for flank pain.  All other systems reviewed and are negative.     Allergies  Review of patient's allergies indicates no known allergies.  Home Medications   Prior to Admission medications   Medication Sig Start Date End Date Taking? Authorizing Provider  sildenafil (VIAGRA) 100 MG tablet Take 100 mg by mouth daily as needed for erectile dysfunction.   Yes Historical Provider, MD  fluticasone (FLONASE) 50 MCG/ACT nasal spray Place 2 sprays into the nose daily. 06/21/13   Thao P Le, DO  predniSONE (DELTASONE) 20 MG tablet Take 2 tablets (40 mg total) by mouth daily. 10/23/12   Robyn Haber, MD   BP 125/74 mmHg   Pulse 60  Temp(Src) 98.3 F (36.8 C) (Oral)  Resp 18  Ht 6\' 5"  (1.956 m)  Wt 200 lb (90.719 kg)  BMI 23.71 kg/m2  SpO2 100% Physical Exam  Constitutional: He is oriented to person, place, and time. He appears well-developed and well-nourished.  HENT:  Head: Normocephalic and atraumatic.  Cardiovascular: Normal rate and regular rhythm.   No murmur heard. Pulmonary/Chest: Effort normal and breath sounds normal. No respiratory distress.  Abdominal: Soft. There is no tenderness. There is no rebound and no guarding.  No CVA tenderness  Musculoskeletal: He exhibits no edema or tenderness.  Neurological: He is alert and oriented to person, place, and time.  Skin: Skin is warm and dry.  Psychiatric: He has a normal mood and affect. His behavior is normal.  Nursing note and vitals reviewed.   ED Course  Procedures (including critical care time) Labs Review Labs Reviewed  BASIC METABOLIC PANEL - Abnormal; Notable for the following:    Glucose, Bld 116 (*)    BUN 29 (*)    Creatinine, Ser 1.37 (*)    All other components within normal limits  CBC WITH DIFFERENTIAL/PLATELET - Abnormal; Notable for the following:    Neutrophils Relative % 86 (*)    Neutro Abs 7.9 (*)    Lymphocytes Relative 9 (*)    All other components within normal limits  URINALYSIS, ROUTINE W REFLEX MICROSCOPIC (NOT AT [facility]) - Abnormal; Notable for the following:    Color, Urine AMBER (*)    APPearance TURBID (*)    Hgb urine dipstick MODERATE (*)    Bilirubin Urine SMALL (*)    Ketones, ur 15 (*)    All other components within normal limits  URINE MICROSCOPIC-ADD ON - Abnormal; Notable for the following:    Bacteria, UA FEW (*)    Crystals CA OXALATE CRYSTALS (*)    All other components within normal limits  URINE CULTURE    Imaging Review Ct Renal Stone Study  06/16/2015   CLINICAL DATA:  Left-sided flank pain. Left anterior abdominal pain.  EXAM: CT ABDOMEN AND PELVIS WITHOUT CONTRAST  TECHNIQUE:  Multidetector CT imaging of the abdomen and pelvis was performed following the standard protocol without IV contrast.  COMPARISON:  None.  FINDINGS: The lung bases demonstrate mild dependent atelectasis bilaterally. The heart size is normal. No significant pleural or pericardial effusion is present.  The liver and spleen are within normal limits. The stomach, duodenum, and pancreas are unremarkable. The common bile duct and gallbladder are within normal limits. The adrenal glands are normal bilaterally. Ill-defined density along the medullary pyramids of the right kidney without focal stones.  A nonobstructing 6 mm stone is present in the left kidney. The left ureter is dilated. An obstructing 6 mm stone is present just above the left UVJ. Urinary bladder is decompressed.  The rectosigmoid colon is within normal limits. The more proximal colon is within normal limits. The appendix is visualized and normal. Small bowel is unremarkable. No significant adenopathy or free fluid is present.  Bone windows demonstrate chronic loss of disc height at L5-S1 with mild endplate change. No focal lytic or blastic lesions are present.  IMPRESSION: 1. Obstructing 6 mm distal left ureteral stone just above the UVJ. This results in moderate dilation of the left ureter and mild left hydronephrosis. 2. An additional nonobstructing 6 mm stone is present in the midportion of the left kidney. 3. No significant stones on the right. 4. Focal degenerative disc disease at L5-S1.   Electronically Signed   By: San Morelle M.D.   On: 06/16/2015 16:31     EKG Interpretation None      MDM   Final diagnoses:  Renal colic on left side    Pt here for evaluation of left flank pain, CT with 29mm stone.  Pt's pain improved in ED.  No evidence of acute UTI or infectious process.  Mild renal insufficiency on BMP.  Discussed home care for renal colic, outpatient follow up, return precautions.      Quintella Reichert, MD 06/16/15  [phone]

## 2015-06-16 NOTE — ED Notes (Signed)
Urine strainer given to pt.

## 2015-06-16 NOTE — ED Notes (Addendum)
Left flank pain that started at 1230 today.  Went to [facility].  Sent here for eval for kidney stone.  Toradol 60mg  IM and Phenergan 25mg  IM given at 3pm at pmd office.

## 2015-06-16 NOTE — ED Notes (Signed)
MD at bedside. 

## 2015-06-16 NOTE — ED Notes (Signed)
Back from CT

## 2015-06-18 ENCOUNTER — Encounter (HOSPITAL_COMMUNITY): Payer: Self-pay

## 2015-06-18 ENCOUNTER — Other Ambulatory Visit: Payer: Self-pay | Admitting: Urology

## 2015-06-18 ENCOUNTER — Emergency Department (HOSPITAL_COMMUNITY)
Admission: EM | Admit: 2015-06-18 | Discharge: 2015-06-18 | Disposition: A | Discharge status: Home / Self Care | Payer: BLUE CROSS/BLUE SHIELD | Attending: Emergency Medicine | Admitting: Emergency Medicine

## 2015-06-18 ENCOUNTER — Encounter (HOSPITAL_COMMUNITY): Payer: Self-pay | Admitting: *Deleted

## 2015-06-18 DIAGNOSIS — N200 Calculus of kidney: Secondary | ICD-10-CM | POA: Insufficient documentation

## 2015-06-18 DIAGNOSIS — F329 Major depressive disorder, single episode, unspecified: Secondary | ICD-10-CM | POA: Diagnosis not present

## 2015-06-18 DIAGNOSIS — F419 Anxiety disorder, unspecified: Secondary | ICD-10-CM | POA: Diagnosis not present

## 2015-06-18 DIAGNOSIS — E780 Pure hypercholesterolemia: Secondary | ICD-10-CM | POA: Diagnosis not present

## 2015-06-18 DIAGNOSIS — Z79899 Other long term (current) drug therapy: Secondary | ICD-10-CM | POA: Diagnosis not present

## 2015-06-18 DIAGNOSIS — Z8659 Personal history of other mental and behavioral disorders: Secondary | ICD-10-CM | POA: Diagnosis not present

## 2015-06-18 DIAGNOSIS — N201 Calculus of ureter: Secondary | ICD-10-CM | POA: Diagnosis present

## 2015-06-18 LAB — I-STAT CHEM 8, ED
BUN: 18 mg/dL (ref 6–20)
CALCIUM ION: 1.12 mmol/L (ref 1.12–1.23)
CHLORIDE: 102 mmol/L (ref 101–111)
Creatinine, Ser: 1.3 mg/dL — ABNORMAL HIGH (ref 0.61–1.24)
Glucose, Bld: 97 mg/dL (ref 65–99)
HCT: 41 % (ref 39.0–52.0)
Hemoglobin: 13.9 g/dL (ref 13.0–17.0)
POTASSIUM: 3.6 mmol/L (ref 3.5–5.1)
Sodium: 138 mmol/L (ref 135–145)
TCO2: 26 mmol/L (ref 0–100)

## 2015-06-18 LAB — URINALYSIS, ROUTINE W REFLEX MICROSCOPIC
Bilirubin Urine: NEGATIVE
Glucose, UA: NEGATIVE mg/dL
KETONES UR: NEGATIVE mg/dL
Nitrite: NEGATIVE
Protein, ur: NEGATIVE mg/dL
Specific Gravity, Urine: 1.01 (ref 1.005–1.030)
Urobilinogen, UA: 0.2 mg/dL (ref 0.0–1.0)
pH: 5.5 (ref 5.0–8.0)

## 2015-06-18 LAB — URINE MICROSCOPIC-ADD ON

## 2015-06-18 LAB — URINE CULTURE: CULTURE: NO GROWTH

## 2015-06-18 MED ORDER — KETOROLAC TROMETHAMINE 60 MG/2ML IM SOLN
60.0000 mg | Freq: Once | INTRAMUSCULAR | Status: AC
  Administered 2015-06-18: 60 mg via INTRAMUSCULAR
  Filled 2015-06-18: qty 2

## 2015-06-18 MED ORDER — OXYCODONE-ACETAMINOPHEN 5-325 MG PO TABS: 1.0000 | ORAL_TABLET | Freq: Four times a day (QID) | ORAL | Status: DC | PRN

## 2015-06-18 MED ORDER — ONDANSETRON HCL 4 MG/2ML IJ SOLN
4.0000 mg | Freq: Once | INTRAMUSCULAR | Status: DC
  Filled 2015-06-18: qty 2

## 2015-06-18 MED ORDER — MORPHINE SULFATE 4 MG/ML IJ SOLN
4.0000 mg | Freq: Once | INTRAMUSCULAR | Status: DC
  Filled 2015-06-18: qty 1

## 2015-06-18 NOTE — Anesthesia Preprocedure Evaluation (Addendum)
Anesthesia Evaluation  Patient identified by MRN, date of birth, ID band Patient awake    Reviewed: Allergy & Precautions, NPO status , Patient's Chart, lab work & pertinent test results, reviewed documented beta blocker date and time   Airway Mallampati: II       Dental  (+) Dental Advisory Given, Teeth Intact   Pulmonary neg pulmonary ROS,  breath sounds clear to auscultation        Cardiovascular negative cardio ROS  Rhythm:Regular     Neuro/Psych Anxiety Depression negative neurological ROS     GI/Hepatic negative GI ROS, Neg liver ROS,   Endo/Other  negative endocrine ROS  Renal/GU Obstructing L distal ureter and L kidney.  Creat 1.3     Musculoskeletal   Abdominal (+)  Abdomen: soft.    Peds  Hematology   Anesthesia Other Findings   Reproductive/Obstetrics                            Anesthesia Physical Anesthesia Plan  ASA: II  Anesthesia Plan: General   Post-op Pain Management:    Induction: Intravenous  Airway Management Planned: LMA and Oral ETT  Additional Equipment:   Intra-op Plan:   Post-operative Plan: Extubation in OR  Informed Consent: I have reviewed the patients History and Physical, chart, labs and discussed the procedure including the risks, benefits and alternatives for the proposed anesthesia with the patient or authorized representative who has indicated his/her understanding and acceptance.     Plan Discussed with:   Anesthesia Plan Comments:         Anesthesia Quick Evaluation

## 2015-06-18 NOTE — Progress Notes (Signed)
Called for release of Orders in Epic for Same day surgery 06-19-15 Thanks

## 2015-06-18 NOTE — ED Notes (Signed)
Pt here 2 days ago and dx with stone.  Pain continues.  Burning with urination.  Has appt on Tuesday with urology.  No fever.  Pt pain meds not controlling pain.  Nausea

## 2015-06-18 NOTE — Discharge Instructions (Signed)
Continue taking Flomax.  Take Percocet as needed for pain.  Can take 2 Percocet three times daily if needed.

## 2015-06-18 NOTE — ED Provider Notes (Signed)
CSN: [id number]     Arrival date & time 06/18/15  0930 History   First MD Initiated Contact with Patient 06/18/15 [phone]     Chief Complaint  Patient presents with  . Nephrolithiasis     (Consider location/radiation/quality/duration/timing/severity/associated sxs/prior Treatment) HPI Comments: Patient who was recently diagnosed with a 6 mm kidney stone of the left UVJ two days ago presents today with a chief complaint of left flank pain radiating to the left groin.  He reports that the pain is similar to the pain that he had two days ago.  He has been taking one Percocet every 6 hours for the pain.  He reports that the Percocet had been helping with the pain, however, he did not feel that the Percocet helped this morning.  He denies nausea, vomiting, fever, or chills.  He does report mild dysuria.  Denies urgency or increased frequency.  He has an appointment scheduled with Urology on 06/22/15.    The history is provided by the patient.    Past Medical History  Diagnosis Date  . Allergy   . Anxiety   . Depression    Past Surgical History  Procedure Laterality Date  . Lumbar disc surgery     Family History  Problem Relation Age of Onset  . Cancer Maternal Grandmother   . Diabetes Maternal Grandfather    History  Substance Use Topics  . Smoking status: Never Smoker   . Smokeless tobacco: Not on file  . Alcohol Use: Yes     Comment: occ    Review of Systems  All other systems reviewed and are negative.     Allergies  Review of patient's allergies indicates no known allergies.  Home Medications   Prior to Admission medications   Medication Sig Start Date End Date Taking? Authorizing Provider  fluticasone (FLONASE) 50 MCG/ACT nasal spray Place 2 sprays into the nose daily. Patient taking differently: Place 2 sprays into the nose daily as needed for allergies.  06/21/13  Yes Thao P Le, DO  oxyCODONE-acetaminophen (PERCOCET/ROXICET) 5-325 MG per tablet Take 1 tablet by mouth  every 6 (six) hours as needed for severe pain. 06/16/15  Yes Quintella Reichert, MD  sildenafil (VIAGRA) 100 MG tablet Take 100 mg by mouth daily as needed for erectile dysfunction.   Yes Historical Provider, MD  tamsulosin (FLOMAX) 0.4 MG CAPS capsule Take 1 capsule (0.4 mg total) by mouth daily. 06/16/15  Yes Quintella Reichert, MD   BP 124/83 mmHg  Pulse 56  Temp(Src) 97.9 F (36.6 C) (Oral)  Resp 18  SpO2 98% Physical Exam  Constitutional: He appears well-developed and well-nourished.  HENT:  Head: Normocephalic and atraumatic.  Neck: Normal range of motion. Neck supple.  Cardiovascular: Normal rate, regular rhythm and normal heart sounds.   Pulmonary/Chest: Effort normal and breath sounds normal.  Abdominal: Soft. Bowel sounds are normal. He exhibits no distension and no mass. There is no tenderness. There is CVA tenderness. There is no rebound and no guarding.  Left CVA tenderness.   Musculoskeletal: Normal range of motion.  Neurological: He is alert.  Skin: Skin is warm and dry.  Psychiatric: He has a normal mood and affect.  Nursing note and vitals reviewed.   ED Course  Procedures (including critical care time) Labs Review Labs Reviewed  URINALYSIS, ROUTINE W REFLEX MICROSCOPIC (NOT AT [facility])  I-STAT CHEM 8, ED    Imaging Review Ct Renal Stone Study  06/16/2015   CLINICAL DATA:  Left-sided flank pain. Left  anterior abdominal pain.  EXAM: CT ABDOMEN AND PELVIS WITHOUT CONTRAST  TECHNIQUE: Multidetector CT imaging of the abdomen and pelvis was performed following the standard protocol without IV contrast.  COMPARISON:  None.  FINDINGS: The lung bases demonstrate mild dependent atelectasis bilaterally. The heart size is normal. No significant pleural or pericardial effusion is present.  The liver and spleen are within normal limits. The stomach, duodenum, and pancreas are unremarkable. The common bile duct and gallbladder are within normal limits. The adrenal glands are normal  bilaterally. Ill-defined density along the medullary pyramids of the right kidney without focal stones.  A nonobstructing 6 mm stone is present in the left kidney. The left ureter is dilated. An obstructing 6 mm stone is present just above the left UVJ. Urinary bladder is decompressed.  The rectosigmoid colon is within normal limits. The more proximal colon is within normal limits. The appendix is visualized and normal. Small bowel is unremarkable. No significant adenopathy or free fluid is present.  Bone windows demonstrate chronic loss of disc height at L5-S1 with mild endplate change. No focal lytic or blastic lesions are present.  IMPRESSION: 1. Obstructing 6 mm distal left ureteral stone just above the UVJ. This results in moderate dilation of the left ureter and mild left hydronephrosis. 2. An additional nonobstructing 6 mm stone is present in the midportion of the left kidney. 3. No significant stones on the right. 4. Focal degenerative disc disease at L5-S1.   Electronically Signed   By: San Morelle M.D.   On: 06/16/2015 16:31     EKG Interpretation None      MDM   Final diagnoses:  None   Patient with a recently diagnosed with a 6 mm kidney stone of the left UVJ presents today with left flank pain.  Pain controlled in the ED.  Patient tolerating PO liquids.  Urine not showing any clear infection.  Urine cultured. Patient is afebrile.  No significant change in Creatine.  Patient stable for discharge.  He has an appointment scheduled with Urology in four days.  Patient is stable for discharge.  Return precautions given.      Hyman Bible, PA-C 06/19/15 2117  Carmin Muskrat, MD 06/20/15 [phone]

## 2015-06-19 ENCOUNTER — Ambulatory Visit (HOSPITAL_COMMUNITY): Payer: BLUE CROSS/BLUE SHIELD | Admitting: Anesthesiology

## 2015-06-19 ENCOUNTER — Ambulatory Visit (HOSPITAL_COMMUNITY)
Admission: RE | Admit: 2015-06-19 | Discharge: 2015-06-19 | Disposition: A | Discharge status: Home / Self Care | Payer: BLUE CROSS/BLUE SHIELD | Source: Ambulatory Visit | Attending: Urology | Admitting: Urology

## 2015-06-19 ENCOUNTER — Encounter (HOSPITAL_COMMUNITY)
Admission: RE | Disposition: A | Discharge status: Home / Self Care | Payer: Self-pay | Source: Ambulatory Visit | Attending: Urology

## 2015-06-19 ENCOUNTER — Ambulatory Visit (HOSPITAL_COMMUNITY): Payer: BLUE CROSS/BLUE SHIELD

## 2015-06-19 ENCOUNTER — Encounter (HOSPITAL_COMMUNITY): Payer: Self-pay | Admitting: *Deleted

## 2015-06-19 DIAGNOSIS — F419 Anxiety disorder, unspecified: Secondary | ICD-10-CM | POA: Insufficient documentation

## 2015-06-19 DIAGNOSIS — E780 Pure hypercholesterolemia: Secondary | ICD-10-CM | POA: Insufficient documentation

## 2015-06-19 DIAGNOSIS — Z79899 Other long term (current) drug therapy: Secondary | ICD-10-CM | POA: Insufficient documentation

## 2015-06-19 DIAGNOSIS — F329 Major depressive disorder, single episode, unspecified: Secondary | ICD-10-CM | POA: Insufficient documentation

## 2015-06-19 DIAGNOSIS — N201 Calculus of ureter: Secondary | ICD-10-CM

## 2015-06-19 HISTORY — PX: CYSTOSCOPY WITH RETROGRADE PYELOGRAM, URETEROSCOPY AND STENT PLACEMENT: SHX5789

## 2015-06-19 HISTORY — DX: Chronic kidney disease, unspecified: N18.9

## 2015-06-19 LAB — URINE CULTURE
Culture: NO GROWTH
SPECIAL REQUESTS: NORMAL

## 2015-06-19 SURGERY — CYSTOURETEROSCOPY, WITH RETROGRADE PYELOGRAM AND STENT INSERTION
Anesthesia: General | Site: Ureter | Laterality: Left

## 2015-06-19 MED ORDER — 0.9 % SODIUM CHLORIDE (POUR BTL) OPTIME
TOPICAL | Status: DC | PRN
  Administered 2015-06-19: 1000 mL

## 2015-06-19 MED ORDER — PROMETHAZINE HCL 25 MG/ML IJ SOLN: 6.2500 mg | INTRAMUSCULAR | Status: DC | PRN

## 2015-06-19 MED ORDER — LIDOCAINE HCL 2 % EX GEL
CUTANEOUS | Status: DC | PRN
  Administered 2015-06-19: 1

## 2015-06-19 MED ORDER — MIDAZOLAM HCL 2 MG/2ML IJ SOLN
INTRAMUSCULAR | Status: AC
  Filled 2015-06-19: qty 2

## 2015-06-19 MED ORDER — CEFAZOLIN SODIUM-DEXTROSE 2-3 GM-% IV SOLR
INTRAVENOUS | Status: AC
  Filled 2015-06-19: qty 50

## 2015-06-19 MED ORDER — FENTANYL CITRATE (PF) 100 MCG/2ML IJ SOLN
INTRAMUSCULAR | Status: DC | PRN
  Administered 2015-06-19 (×2): 25 ug via INTRAVENOUS

## 2015-06-19 MED ORDER — MIDAZOLAM HCL 5 MG/5ML IJ SOLN
INTRAMUSCULAR | Status: DC | PRN
  Administered 2015-06-19: 2 mg via INTRAVENOUS

## 2015-06-19 MED ORDER — ACETAMINOPHEN 10 MG/ML IV SOLN
1000.0000 mg | Freq: Once | INTRAVENOUS | Status: AC
  Administered 2015-06-19: 1000 mg via INTRAVENOUS

## 2015-06-19 MED ORDER — BELLADONNA ALKALOIDS-OPIUM 16.2-60 MG RE SUPP
RECTAL | Status: DC | PRN
  Administered 2015-06-19: 1 via RECTAL

## 2015-06-19 MED ORDER — ONDANSETRON HCL 4 MG/2ML IJ SOLN
INTRAMUSCULAR | Status: DC | PRN
  Administered 2015-06-19: 4 mg via INTRAVENOUS

## 2015-06-19 MED ORDER — CEFAZOLIN SODIUM-DEXTROSE 2-3 GM-% IV SOLR
2.0000 g | INTRAVENOUS | Status: AC
  Administered 2015-06-19: 2 g via INTRAVENOUS

## 2015-06-19 MED ORDER — IOHEXOL 300 MG/ML  SOLN
INTRAMUSCULAR | Status: DC | PRN
  Administered 2015-06-19: 3 mL

## 2015-06-19 MED ORDER — KETOROLAC TROMETHAMINE 30 MG/ML IJ SOLN: 30.0000 mg | Freq: Once | INTRAMUSCULAR | Status: DC

## 2015-06-19 MED ORDER — LIDOCAINE HCL 2 % EX GEL
CUTANEOUS | Status: AC
  Filled 2015-06-19: qty 10

## 2015-06-19 MED ORDER — PHENAZOPYRIDINE HCL 200 MG PO TABS: 200.0000 mg | ORAL_TABLET | Freq: Three times a day (TID) | ORAL | Status: DC | PRN

## 2015-06-19 MED ORDER — MEPERIDINE HCL 50 MG/ML IJ SOLN
INTRAMUSCULAR | Status: AC
  Filled 2015-06-19: qty 1

## 2015-06-19 MED ORDER — BELLADONNA ALKALOIDS-OPIUM 16.2-60 MG RE SUPP
RECTAL | Status: AC
  Filled 2015-06-19: qty 1

## 2015-06-19 MED ORDER — TRIMETHOPRIM 100 MG PO TABS: 100.0000 mg | ORAL_TABLET | ORAL | Status: DC

## 2015-06-19 MED ORDER — FENTANYL CITRATE (PF) 100 MCG/2ML IJ SOLN
INTRAMUSCULAR | Status: AC
  Filled 2015-06-19: qty 2

## 2015-06-19 MED ORDER — PROPOFOL 10 MG/ML IV BOLUS
INTRAVENOUS | Status: AC
  Filled 2015-06-19: qty 20

## 2015-06-19 MED ORDER — OXYCODONE-ACETAMINOPHEN 5-325 MG PO TABS: 1.0000 | ORAL_TABLET | ORAL | Status: DC | PRN

## 2015-06-19 MED ORDER — FENTANYL CITRATE (PF) 100 MCG/2ML IJ SOLN: 25.0000 ug | INTRAMUSCULAR | Status: DC | PRN

## 2015-06-19 MED ORDER — SODIUM CHLORIDE 0.9 % IR SOLN
Status: DC | PRN
  Administered 2015-06-19: 3000 mL

## 2015-06-19 MED ORDER — LACTATED RINGERS IV SOLN
INTRAVENOUS | Status: DC
  Administered 2015-06-19: 10:00:00 via INTRAVENOUS

## 2015-06-19 MED ORDER — MEPERIDINE HCL 50 MG/ML IJ SOLN
6.2500 mg | INTRAMUSCULAR | Status: DC | PRN
  Administered 2015-06-19: 12.5 mg via INTRAVENOUS

## 2015-06-19 MED ORDER — PROPOFOL 10 MG/ML IV BOLUS
INTRAVENOUS | Status: DC | PRN
Start: 2015-06-19 — End: 2015-06-19
  Administered 2015-06-19: 50 mg via INTRAVENOUS
  Administered 2015-06-19: 150 mg via INTRAVENOUS

## 2015-06-19 MED ORDER — ACETAMINOPHEN 10 MG/ML IV SOLN
INTRAVENOUS | Status: AC
  Filled 2015-06-19: qty 100

## 2015-06-19 MED ORDER — LIDOCAINE HCL (CARDIAC) 20 MG/ML IV SOLN
INTRAVENOUS | Status: DC | PRN
  Administered 2015-06-19: 50 mg via INTRAVENOUS

## 2015-06-19 SURGICAL SUPPLY — 17 items
BAG URO CATCHER STRL LF (DRAPE) ×3 IMPLANT
CATH INTERMIT  6FR 70CM (CATHETERS) ×3 IMPLANT
CLOTH BEACON ORANGE TIMEOUT ST (SAFETY) ×3 IMPLANT
FIBER LASER FLEXIVA 1000 (UROLOGICAL SUPPLIES) IMPLANT
FIBER LASER FLEXIVA 200 (UROLOGICAL SUPPLIES) IMPLANT
FIBER LASER FLEXIVA 365 (UROLOGICAL SUPPLIES) IMPLANT
FIBER LASER FLEXIVA 550 (UROLOGICAL SUPPLIES) IMPLANT
FIBER LASER TRAC TIP (UROLOGICAL SUPPLIES) IMPLANT
GLOVE BIOGEL M STRL SZ7.5 (GLOVE) ×3 IMPLANT
GOWN STRL REUS W/TWL XL LVL3 (GOWN DISPOSABLE) ×3 IMPLANT
GUIDEWIRE STR DUAL SENSOR (WIRE) ×3 IMPLANT
MANIFOLD NEPTUNE II (INSTRUMENTS) ×3 IMPLANT
NS IRRIG 1000ML POUR BTL (IV SOLUTION) ×3 IMPLANT
PACK CYSTO (CUSTOM PROCEDURE TRAY) ×3 IMPLANT
SCRUB PCMX 4 OZ (MISCELLANEOUS) ×3 IMPLANT
STENT CONTOUR 6FRX26X.038 (STENTS) ×3 IMPLANT
TUBING CONNECTING 10 (TUBING) ×3 IMPLANT

## 2015-06-19 NOTE — Op Note (Signed)
Pre-operative diagnosis :   Distal left ureteral calculus, 6 mm  Postoperative diagnosis: Same    Operation:  Cystourethroscopy, left retrograde pyelogram interpretation, ureteroscopy, basket extraction of left ureteral calculus   Surgeon:  S. Gaynelle Arabian, MD  First assistant:  None   Anesthesia:  General LMA  Preparation:  After appropriate anesthesia, the patient was brought the operating, placed on the operating table in the dorsal supine position with general LMA anesthesia was introduced. He was then replaced in the dorsal lithotomy position with pubis was prepped with Betadine solution and draped in usual fashion. The armband was double checked. History was double checked.. The CT scan reviewed.   Review history:  Problems  1. Ureteral calculus, left (N20.1)  Assessed By: Carolan Clines (Urology); Last Assessed: 18 Jun 2015  History of Present Illness 39 yo male sith 1st kidney stone event. He noted 2 days of L flank pain, intensifying ( had to lay on floor). He was seen at Rhetta Mura MD, with severe pain, treated with Toradol, and phenergan. He then went to [facility] for CT, and IV hydration, and discharged on percocet. He was seen at [facility] today for toradol this AM, and is seen now for consideration of "next stepts). Nod hematuria, excepts " black flecks.   He is c/o L flank and LLQ, bur not in inguinal canal or L testis. No gross hematuria. No family hx. No metabolic hx. No meds. No gout. [place]: [place]: 2/week. Drinks water during daytime.    Statement of  Likelihood of Success: Excellent. TIME-OUT observed.:  Procedure:  Cystourethroscopy was accomplished. The urethra was normal. The bladder neck was normal. The bladder base was normal. There was no evidence of bladder stone, tumor, or bladder diverticular formation. Left retrograde pyelogram was performed, showing stone in the left lower ureter. The remaining ureter showed no evidence of hydronephrosis. The upper ureter and  renal pelvis appeared to be within normal limits. A guidewire was passed around the stone, into the renal pelvis.  The 6.5 [place] scope was then passed into the ureter, and into the ureteral orifice. The intramural ureter was difficult to manipulate this scope through. The stone was not visualized in the lower ureter on initial passage, and mid ureter appeared within normal limits. While withdrawing the scope, however, the stone was identified, and appeared to be at least 6 mm in length, and at least 4-5 mm in width. The stone appeared to be pancaked in nature. It was grasped with a basket, and would not easily calmed through the ureter. However, with manipulation, the stone was able to be extracted. Because of difficulty with extraction, I elected to place a double-J stent. A 6 French by 26 cm double-J stent was then passed over the guidewire, coiled in the renal pelvis, and coiled in the bladder without difficulty. The patient tolerated the procedure well. The stone will be taken to the office and sent to laboratory for examination. The patient received IV Tylenol but because his creatinine level was somewhat elevated, he did not receive IV Toradol. He did receive B and O suppository at the end of procedure. He was awakened and taken to recovery room in good condition.

## 2015-06-19 NOTE — H&P (Signed)
Active Problems Problems  1. Ureteral calculus, left (N20.1)   Assessed By: Mark Morton (Urology); Last Assessed: 18 Jun 2015  History of Present Illness 39 yo male sith 1st kidney stone event. He noted 2 days of L flank pain, intensifying ( had to lay on floor). He was seen at [facility], with severe pain, treated with Toradol, and phenergan. He then went to [facility] for CT, and IV hydration, and discharged on percocet. He was seen at [facility] today for toradol this AM, and is seen now for consideration of "next stepts). Nod hematuria, excepts " black flecks.    He is c/o L flank and LLQ, bur not in inguinal canal or L testis. No gross hematuria. No family hx. No metabolic hx. No meds. No gout. Sodas: Mt. [employer]: 2/week. Drinks water during daytime.    Past Medical History Problems  1. History of depression (Z86.59) 2. History of hypercholesterolemia (Z86.39)  Surgical History Problems  1. History of Spinal Diskectomy Lumbar  Current Meds 1. Methocarbamol TABS;  Therapy: (Recorded:29Jul2016) to Recorded 2. Multi-Day TABS;  Therapy: (Recorded:02Oct2014) to Recorded 3. Sildenafil Citrate 20 MG Oral Tablet; Take 2-5 tablets as needed;  Therapy: [id number] to (Last Rx:11Jun2015)  Requested for: 11Jun2015 Ordered  Allergies Medication  1. No Known Drug Allergies  Family History Problems  1. Family history of Cancer 2. Family history of Hypertension  Social History Problems  1. Alcohol Use 2. Caffeine Use 3. Marital History - Currently Married 4. Never A Smoker  Review of Systems Constitutional, skin, eye, otolaryngeal, hematologic/lymphatic, cardiovascular, pulmonary, endocrine, musculoskeletal, neurological and psychiatric system(s) were reviewed and pertinent findings if present are noted and are otherwise negative.  Genitourinary: no urinary frequency, no feelings of urinary urgency, no dysuria, no nocturia, no incontinence, no difficulty starting the urinary stream,  urine stream is not weak, urinary stream does not start and stop, no incomplete emptying of bladder, no post-void dribbling, no hematuria, no urethral discharge, urine is not foul-smelling, preserved libido, no testicular pain and initiating urination does not require straining.  Gastrointestinal: flank pain, but no nausea, no vomiting, no abdominal pain, no diarrhea and no constipation.    Vitals Vital Signs [Data Includes: Last 1 Day]  Recorded: 29Jul2016 02:07PM  Height: 6 ft 5 in Weight: 200 lb  BMI Calculated: 23.72 BSA Calculated: 2.24 Blood Pressure: 108 / 72 Temperature: 97.7 F Heart Rate: 75  Physical Exam Constitutional: Well nourished and well developed . No acute distress.  ENT:. The ears and nose are normal in appearance.  Neck: The appearance of the neck is normal and no neck mass is present.  Pulmonary: No respiratory distress and normal respiratory rhythm and effort.  Cardiovascular: Heart rate and rhythm are normal . No peripheral edema.  Abdomen: The abdomen is soft and nontender. No masses are palpated. No CVA tenderness. No hernias are palpable. No hepatosplenomegaly noted.  Rectal: Rectal exam demonstrates normal sphincter tone, no tenderness and no masses. The prostate has no nodularity and is not tender. The left seminal vesicle is nonpalpable. The right seminal vesicle is nonpalpable. The perineum is normal on inspection.  Genitourinary: Examination of the penis demonstrates no discharge, no masses, no lesions and a normal meatus. The scrotum is without lesions. The right epididymis is palpably normal and non-tender. The left epididymis is palpably normal and non-tender. The right testis is non-tender and without masses. The left testis is non-tender and without masses.  Lymphatics: The femoral and inguinal nodes are not enlarged or tender.  Skin: Normal skin turgor, no visible rash and no visible skin lesions.  Neuro/Psych:. Mood and affect are appropriate.     Results/Data Urine [Data Includes: Last 1 Day]   [id number]  COLOR YELLOW   APPEARANCE CLEAR   SPECIFIC GRAVITY 1.010   pH 5.5   GLUCOSE NEGATIVE   BILIRUBIN NEGATIVE   KETONE NEGATIVE   BLOOD 3+   PROTEIN NEGATIVE   NITRITE NEGATIVE   LEUKOCYTE ESTERASE NEGATIVE   SQUAMOUS EPITHELIAL/HPF 0-5 HPF  WBC 0-5 WBC/HPF  RBC 3-10 RBC/HPF  BACTERIA NONE SEEN HPF  CRYSTALS NONE SEEN HPF  CASTS NONE SEEN LPF  Yeast NONE SEEN HPF   Assessment Assessed  1. Ureteral calculus, left (N20.1)  Possible op surgery tomorrow.   Hematuria.   Plan Health Maintenance  1. UA With REFLEX; [Do Not Release]; Status:Resulted - Requires Verification;   Done:  [id number] 01:17PM  Surgery tomorrow.   Signatures Electronically signed by : Mark Morton, M.D.; Jun 18 2015  2:47PM EST

## 2015-06-19 NOTE — Discharge Instructions (Signed)

## 2015-06-19 NOTE — Transfer of Care (Signed)
Immediate Anesthesia Transfer of Care Note  Patient: Mark Morton  Procedure(s) Performed: Procedure(s): CYSTOSCOPY WITH RETROGRADE PYELOGRAM, LEFT URETEROSCOPY, STONE BASKETRY AND LEFT JJ STENT PLACEMENT (Left)  Patient Location: PACU  Anesthesia Type:General  Level of Consciousness:  sedated, patient cooperative and responds to stimulation  Airway & Oxygen Therapy:Patient Spontanous Breathing and Patient connected to face mask oxgen  Post-op Assessment:  Report given to PACU RN and Post -op Vital signs reviewed and stable  Post vital signs:  Reviewed and stable  Last Vitals:  Filed Vitals:   06/19/15 1040  BP: 111/69  Pulse: 51  Temp:   Resp: 9    Complications: No apparent anesthesia complications

## 2015-06-19 NOTE — Anesthesia Procedure Notes (Signed)
Procedure Name: LMA Insertion Date/Time: 06/19/2015 9:45 AM Performed by: Anne Fu Pre-anesthesia Checklist: Patient identified, Emergency Drugs available, Suction available, Patient being monitored and Timeout performed Patient Re-evaluated:Patient Re-evaluated prior to inductionOxygen Delivery Method: Circle system utilized Preoxygenation: Pre-oxygenation with 100% oxygen Intubation Type: IV induction Ventilation: Mask ventilation without difficulty LMA: LMA inserted LMA Size: 4.0 Number of attempts: 1 Placement Confirmation: positive ETCO2 and breath sounds checked- equal and bilateral Tube secured with: Tape

## 2015-06-19 NOTE — Anesthesia Postprocedure Evaluation (Signed)
  Anesthesia Post-op Note  Patient: Mark Morton  Procedure(s) Performed: Procedure(s): CYSTOSCOPY WITH RETROGRADE PYELOGRAM, LEFT URETEROSCOPY, STONE BASKETRY AND LEFT JJ STENT PLACEMENT (Left)  Patient Location: PACU  Anesthesia Type:General  Level of Consciousness: awake  Airway and Oxygen Therapy: Patient Spontanous Breathing  Post-op Pain: mild  Post-op Assessment: Post-op Vital signs reviewed, Patient's Cardiovascular Status Stable, Respiratory Function Stable, Patent Airway and No signs of Nausea or vomiting              Post-op Vital Signs: Reviewed and stable  Last Vitals:  Filed Vitals:   06/19/15 1115  BP: 110/80  Pulse: 60  Temp:   Resp: 13    Complications: No apparent anesthesia complications

## 2015-06-19 NOTE — Interval H&P Note (Signed)
History and Physical Interval Note:  06/19/2015 8:38 AM  Mark Morton  has presented today for surgery, with the diagnosis of LEFT URETERAL STONE  The various methods of treatment have been discussed with the patient and family. After consideration of risks, benefits and other options for treatment, the patient has consented to  Procedure(s): CYSTOSCOPY WITH RETROGRADE PYELOGRAM, URETEROSCOPY AND POSSIBLE JJ STENT PLACEMENT1  (Left)  WITH HOLMIUM LASER LITHOTRIPSY (Left) as a surgical intervention .  The patient's history has been reviewed, patient examined, no change in status, stable for surgery.  I have reviewed the patient's chart and labs.  Questions were answered to the patient's satisfaction.     Christhoper Busbee I Onyx Edgley

## 2015-06-21 ENCOUNTER — Encounter (HOSPITAL_COMMUNITY): Payer: Self-pay | Admitting: Urology

## 2015-06-28 ENCOUNTER — Other Ambulatory Visit: Payer: Self-pay | Admitting: *Deleted

## 2015-06-28 DIAGNOSIS — I83893 Varicose veins of bilateral lower extremities with other complications: Secondary | ICD-10-CM

## 2015-07-28 ENCOUNTER — Encounter (HOSPITAL_COMMUNITY): Payer: Self-pay | Admitting: *Deleted

## 2015-07-28 ENCOUNTER — Emergency Department (HOSPITAL_COMMUNITY)
Admission: EM | Admit: 2015-07-28 | Discharge: 2015-07-28 | Disposition: A | Discharge status: Home / Self Care | Payer: BLUE CROSS/BLUE SHIELD | Attending: Emergency Medicine | Admitting: Emergency Medicine

## 2015-07-28 DIAGNOSIS — T781XXA Other adverse food reactions, not elsewhere classified, initial encounter: Secondary | ICD-10-CM | POA: Diagnosis present

## 2015-07-28 DIAGNOSIS — Y9289 Other specified places as the place of occurrence of the external cause: Secondary | ICD-10-CM | POA: Insufficient documentation

## 2015-07-28 DIAGNOSIS — Z7951 Long term (current) use of inhaled steroids: Secondary | ICD-10-CM | POA: Insufficient documentation

## 2015-07-28 DIAGNOSIS — Y998 Other external cause status: Secondary | ICD-10-CM | POA: Insufficient documentation

## 2015-07-28 DIAGNOSIS — Z791 Long term (current) use of non-steroidal anti-inflammatories (NSAID): Secondary | ICD-10-CM | POA: Insufficient documentation

## 2015-07-28 DIAGNOSIS — Z87442 Personal history of urinary calculi: Secondary | ICD-10-CM | POA: Diagnosis not present

## 2015-07-28 DIAGNOSIS — Z8659 Personal history of other mental and behavioral disorders: Secondary | ICD-10-CM | POA: Diagnosis not present

## 2015-07-28 DIAGNOSIS — Y9389 Activity, other specified: Secondary | ICD-10-CM | POA: Insufficient documentation

## 2015-07-28 DIAGNOSIS — R070 Pain in throat: Secondary | ICD-10-CM | POA: Insufficient documentation

## 2015-07-28 DIAGNOSIS — N189 Chronic kidney disease, unspecified: Secondary | ICD-10-CM | POA: Insufficient documentation

## 2015-07-28 DIAGNOSIS — T7840XA Allergy, unspecified, initial encounter: Secondary | ICD-10-CM

## 2015-07-28 MED ORDER — EPINEPHRINE 0.3 MG/0.3ML IJ SOAJ: 0.3000 mg | Freq: Once | INTRAMUSCULAR | Status: DC

## 2015-07-28 MED ORDER — SODIUM CHLORIDE 0.9 % IV BOLUS (SEPSIS)
1000.0000 mL | Freq: Once | INTRAVENOUS | Status: AC
  Administered 2015-07-28: 1000 mL via INTRAVENOUS

## 2015-07-28 MED ORDER — PREDNISONE 50 MG PO TABS: ORAL_TABLET | ORAL | Status: DC

## 2015-07-28 MED ORDER — DIPHENHYDRAMINE HCL 12.5 MG/5ML PO ELIX
25.0000 mg | ORAL_SOLUTION | Freq: Once | ORAL | Status: AC
  Administered 2015-07-28: 25 mg via ORAL
  Filled 2015-07-28: qty 10

## 2015-07-28 MED ORDER — SODIUM CHLORIDE 0.9 % IV SOLN
Freq: Once | INTRAVENOUS | Status: AC
  Administered 2015-07-28: 20 mL/h via INTRAVENOUS

## 2015-07-28 MED ORDER — FAMOTIDINE IN NACL 20-0.9 MG/50ML-% IV SOLN
20.0000 mg | Freq: Once | INTRAVENOUS | Status: AC
  Administered 2015-07-28: 20 mg via INTRAVENOUS
  Filled 2015-07-28: qty 50

## 2015-07-28 MED ORDER — METHYLPREDNISOLONE SODIUM SUCC 125 MG IJ SOLR
125.0000 mg | Freq: Once | INTRAMUSCULAR | Status: AC
  Administered 2015-07-28: 125 mg via INTRAVENOUS
  Filled 2015-07-28: qty 2

## 2015-07-28 NOTE — Discharge Instructions (Signed)
Prescription for EpiPen and prednisone. Also recommend liquid Benadryl. Follow-up with allergist. Phone number given.

## 2015-07-28 NOTE — ED Notes (Signed)
Pt brought in by [facility] c/o possible allergic reaction to carrots. [facility] reports one episode of vomiting. Fire administered 50 of Benadryl PO pt tolerated well.  Pt reports this happen previously when he ate snap peas.

## 2015-07-28 NOTE — ED Notes (Signed)
Pt reports he has eaten carrots/snap peas for years. Every few months would get a reaction right after eating these foods with throat feeling constricted, episode would last about 10 minutes, but no vomiting ever.   Today at 1115 ate carrots, immediately throat tightening/constricting, slight SOB, and vomited. Reports he feels hot all over. Fire gave 50 mg benadryl PO. Denies pain.

## 2015-07-28 NOTE — ED Provider Notes (Signed)
CSN: [id number]     Arrival date & time 07/28/15  1229 History   First MD Initiated Contact with Patient 07/28/15 1250     Chief Complaint  Patient presents with  . Allergic Reaction     (Consider location/radiation/quality/duration/timing/severity/associated sxs/prior Treatment) HPI..... Status post allergic reaction just prior to arrival. 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 He then ate carrots which he has had a reaction to in the past. He then complained of tightness in his throat. No rash or dyspnea. Patient arrived via EMS. He has taken Benadryl 50 mg orally prior to visit.  Past Medical History  Diagnosis Date  . Allergy   . Anxiety   . Depression   . Chronic kidney disease     Kidney stone   Past Surgical History  Procedure Laterality Date  . Lumbar disc surgery    . Cystoscopy with retrograde pyelogram, ureteroscopy and stent placement Left 06/19/2015    Procedure: CYSTOSCOPY WITH RETROGRADE PYELOGRAM, LEFT URETEROSCOPY, STONE BASKETRY AND LEFT JJ STENT PLACEMENT;  Surgeon: Carolan Clines, MD;  Location: [facility];  Service: Urology;  Laterality: Left;   Family History  Problem Relation Age of Onset  . Cancer Maternal Grandmother   . Diabetes Maternal Grandfather    Social History  Substance Use Topics  . Smoking status: Never Smoker   . Smokeless tobacco: Never Used  . Alcohol Use: Yes     Comment: occ    Review of Systems  All other systems reviewed and are negative.     Allergies  Review of patient's allergies indicates no known allergies.  Home Medications   Prior to Admission medications   Medication Sig Start Date End Date Taking? Authorizing Provider  fluticasone (FLONASE) 50 MCG/ACT nasal spray Place 2 sprays into the nose daily. Patient taking differently: Place 2 sprays into the nose daily as needed for allergies.  06/21/13  Yes Thao P Le, DO  meloxicam (MOBIC) 15 MG tablet Take 15 mg by mouth daily.  07/08/15  Yes Historical Provider, MD  sildenafil (VIAGRA) 100 MG tablet Take 100 mg by mouth daily as needed for erectile dysfunction.   Yes Historical Provider, MD  EPINEPHrine 0.3 mg/0.3 mL IJ SOAJ injection Inject 0.3 mLs (0.3 mg total) into the muscle once. 07/28/15   Nat Christen, MD  oxyCODONE-acetaminophen (PERCOCET/ROXICET) 5-325 MG per tablet Take 1-2 tablets by mouth every 6 (six) hours as needed for severe pain. Patient not taking: Reported on 07/28/2015 06/18/15   Hyman Bible, PA-C  oxyCODONE-acetaminophen (ROXICET) 5-325 MG per tablet Take 1 tablet by mouth every 4 (four) hours as needed for severe pain. Patient not taking: Reported on 07/28/2015 06/19/15   Carolan Clines, MD  phenazopyridine (PYRIDIUM) 200 MG tablet Take 1 tablet (200 mg total) by mouth 3 (three) times daily as needed for pain. Patient not taking: Reported on 07/28/2015 06/19/15   Carolan Clines, MD  predniSONE (DELTASONE) 50 MG tablet Chew 1 tablet at the onset of allergic symptoms 07/28/15   Nat Christen, MD  tamsulosin [facility]) 0.4 MG CAPS capsule Take 1 capsule (0.4 mg total) by mouth daily. Patient not taking: Reported on 07/28/2015 06/16/15   Quintella Reichert, MD  trimethoprim (TRIMPEX) 100 MG tablet Take 1 tablet (100 mg total) by mouth 1 day or 1 dose. Patient not taking: Reported on 07/28/2015 06/19/15   Carolan Clines, MD   BP 115/72 mmHg  Pulse 66  Temp(Src) 98.1 F (36.7 C) (Oral)  Resp 16  SpO2 100% Physical Exam  Constitutional: He is oriented to person, place, and time. He appears well-developed and well-nourished.  HENT:  Head: Normocephalic and atraumatic.  Normal airway  Eyes: Conjunctivae and EOM are normal. Pupils are equal, round, and reactive to light.  Neck: Normal range of motion. Neck supple.  Cardiovascular: Normal rate and regular rhythm.   Pulmonary/Chest: Effort normal and breath sounds normal.  Abdominal: Soft. Bowel sounds are normal.  Musculoskeletal: Normal range of motion.   Neurological: He is alert and oriented to person, place, and time.  Skin: Skin is warm and dry.  Psychiatric: He has a normal mood and affect. His behavior is normal.  Nursing note and vitals reviewed.   ED Course  Procedures (including critical care time) Labs Review Labs Reviewed - No data to display  Imaging Review No results found. I have personally reviewed and evaluated these images and lab results as part of my medical decision-making.   EKG Interpretation None      MDM   Final diagnoses:  Allergic reaction, initial encounter    Patient feels much better after IV Solu-Medrol, IV Benadryl, IV Pepcid. Discharge medications epinephrine pen and prednisone.    Nat Christen, MD 07/28/15 [phone]

## 2015-08-05 ENCOUNTER — Encounter (HOSPITAL_BASED_OUTPATIENT_CLINIC_OR_DEPARTMENT_OTHER): Payer: Self-pay

## 2015-08-05 ENCOUNTER — Emergency Department (HOSPITAL_BASED_OUTPATIENT_CLINIC_OR_DEPARTMENT_OTHER)
Admission: EM | Admit: 2015-08-05 | Discharge: 2015-08-05 | Disposition: A | Discharge status: Home / Self Care | Payer: BLUE CROSS/BLUE SHIELD | Attending: Emergency Medicine | Admitting: Emergency Medicine

## 2015-08-05 DIAGNOSIS — Z8659 Personal history of other mental and behavioral disorders: Secondary | ICD-10-CM | POA: Diagnosis not present

## 2015-08-05 DIAGNOSIS — M79672 Pain in left foot: Secondary | ICD-10-CM | POA: Diagnosis present

## 2015-08-05 DIAGNOSIS — Z791 Long term (current) use of non-steroidal anti-inflammatories (NSAID): Secondary | ICD-10-CM | POA: Diagnosis not present

## 2015-08-05 DIAGNOSIS — G629 Polyneuropathy, unspecified: Secondary | ICD-10-CM | POA: Diagnosis not present

## 2015-08-05 DIAGNOSIS — Z87442 Personal history of urinary calculi: Secondary | ICD-10-CM | POA: Diagnosis not present

## 2015-08-05 LAB — CBG MONITORING, ED: Glucose-Capillary: 113 mg/dL — ABNORMAL HIGH (ref 65–99)

## 2015-08-05 MED ORDER — HYDROCODONE-ACETAMINOPHEN 5-325 MG PO TABS: ORAL_TABLET | ORAL | Status: DC

## 2015-08-05 MED ORDER — PREGABALIN 50 MG PO CAPS
50.0000 mg | ORAL_CAPSULE | Freq: Once | ORAL | Status: DC
  Filled 2015-08-05: qty 1

## 2015-08-05 NOTE — ED Notes (Signed)
Pt verbalizes understanding of d/c instructions and denies any further needs at this time. 

## 2015-08-05 NOTE — Discharge Instructions (Signed)
Please read and follow all provided instructions.  Your diagnoses today include:  1. Peripheral neuropathy    Tests performed today include:  Vital signs. See below for your results today.   Blood sugar - normal  Medications prescribed:   Vicodin (hydrocodone/acetaminophen) - narcotic pain medication  DO NOT drive or perform any activities that require you to be awake and alert because this medicine can make you drowsy. BE VERY CAREFUL not to take multiple medicines containing Tylenol (also called acetaminophen). Doing so can lead to an overdose which can damage your liver and cause liver failure and possibly death.  Take any prescribed medications only as directed.  Home care instructions:  Follow any educational materials contained in this packet.  Follow-up instructions: Please follow-up with your neurologist as planned for further evaluation of your symptoms.   Return instructions:   Please return to the Emergency Department if you experience worsening symptoms.   Please return if you have any other emergent concerns.  Additional Information:  Your vital signs today were: BP 113/80 mmHg   Pulse 68   Temp(Src) 98.4 F (36.9 C) (Oral)   Resp 18   Ht 6\' 5"  (1.956 m)   Wt 210 lb (95.255 kg)   BMI 24.90 kg/m2   SpO2 98% If your blood pressure (BP) was elevated above 135/85 this visit, please have this repeated by your doctor within one month. --------------

## 2015-08-05 NOTE — ED Notes (Signed)
Pt c/o burning to both feet x 1 year-has been seen by PCP and podiatrist x 3 with last appt today-now has r/t to neuro-pt with steady gait-NAD

## 2015-08-05 NOTE — ED Provider Notes (Signed)
CSN: [id number]     Arrival date & time 08/05/15  1753 History   First MD Initiated Contact with Patient 08/05/15 1901     Chief Complaint  Patient presents with  . Foot Pain    (Consider location/radiation/quality/duration/timing/severity/associated sxs/prior Treatment) HPI Comments: Patient with no significant past medical history presents with complaint of burning in his bilateral feet for the past 1-2 years. Burning pain is all over the top and bottom of his feet bilaterally. Course is constant. 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 Patient has been seeing a podiatrist recently. They have prescribed new orthotics which have not seemed to help much. He has been on meloxicam without relief. Today patient was given a prescription for Lyrica and a referral to neurology was set up and patient has an initial appointment with them in 2 weeks. Patient does not have a history of diabetes. He denies weakness or true numbness in his lower extremities. No other signs and symptoms of stroke. No other treatments prior to arrival. Patient wanted a second opinion which is why he came to the emergency department today.  Patient is a 39 y.o. male presenting with lower extremity pain. The history is provided by the patient.  Foot Pain Associated symptoms include myalgias and numbness (burning in feet). Pertinent negatives include no arthralgias, joint swelling, neck pain or weakness.    Past Medical History  Diagnosis Date  . Allergy   . Anxiety   . Depression   . Chronic kidney disease     Kidney stone   Past Surgical History  Procedure Laterality Date  . Lumbar disc surgery    . Cystoscopy with retrograde pyelogram, ureteroscopy and stent placement Left 06/19/2015    Procedure: CYSTOSCOPY WITH RETROGRADE PYELOGRAM, LEFT URETEROSCOPY, STONE BASKETRY AND LEFT JJ STENT PLACEMENT;  Surgeon: Carolan Clines, MD;  Location: [facility];  Service: Urology;  Laterality: Left;   Family History  Problem  Relation Age of Onset  . Cancer Maternal Grandmother   . Diabetes Maternal Grandfather    Social History  Substance Use Topics  . Smoking status: Never Smoker   . Smokeless tobacco: Never Used  . Alcohol Use: Yes     Comment: occ    Review of Systems  Constitutional: Negative for activity change.  Musculoskeletal: Positive for myalgias. Negative for back pain, joint swelling, arthralgias, gait problem and neck pain.  Skin: Negative for wound.  Neurological: Positive for numbness (burning in feet). Negative for weakness.    Allergies  Review of patient's allergies indicates no known allergies.  Home Medications   Prior to Admission medications   Medication Sig Start Date End Date Taking? Authorizing Provider  Pregabalin (LYRICA PO) Take by mouth.   Yes Historical Provider, MD  EPINEPHrine 0.3 mg/0.3 mL IJ SOAJ injection Inject 0.3 mLs (0.3 mg total) into the muscle once. 07/28/15   Nat Christen, MD  fluticasone [facility]) 50 MCG/ACT nasal spray Place 2 sprays into the nose daily. Patient taking differently: Place 2 sprays into the nose daily as needed for allergies.  06/21/13   Thao P Le, DO  meloxicam (MOBIC) 15 MG tablet Take 15 mg by mouth daily. 07/08/15   Historical Provider, MD  phenazopyridine (PYRIDIUM) 200 MG tablet Take 1 tablet (200 mg total) by mouth 3 (three) times daily as needed for pain. Patient not taking: Reported on 07/28/2015 06/19/15   Carolan Clines, MD  sildenafil (VIAGRA) 100 MG tablet Take 100 mg by mouth daily as needed for erectile  dysfunction.    Historical Provider, MD   BP 113/80 mmHg  Pulse 68  Temp(Src) 98.4 F (36.9 C) (Oral)  Resp 18  Ht 6\' 5"  (1.956 m)  Wt 210 lb (95.255 kg)  BMI 24.90 kg/m2  SpO2 98%   Physical Exam  Constitutional: He appears well-developed and well-nourished.  HENT:  Head: Normocephalic and atraumatic.  Eyes: Conjunctivae are normal.  Neck: Normal range of motion. Neck supple.  Cardiovascular:  Pulses:      Radial  pulses are 2+ on the right side, and 2+ on the left side.  Pulmonary/Chest: No respiratory distress.  Neurological: He is alert. A sensory deficit is present.  Burning in soles of feet bilaterally.   Skin: Skin is warm and dry.  Psychiatric: He has a normal mood and affect.  Nursing note and vitals reviewed.   ED Course  Procedures (including critical care time) Labs Review Labs Reviewed  CBG MONITORING, ED - Abnormal; Notable for the following:    Glucose-Capillary 113 (*)    All other components within normal limits    Imaging Review No results found. I have personally reviewed and evaluated these images and lab results as part of my medical decision-making.   EKG Interpretation None       7:34 PM Patient seen and examined. Work-up initiated. Medications ordered.   Vital signs reviewed and are as follows: BP 113/80 mmHg  Pulse 68  Temp(Src) 98.4 F (36.9 C) (Oral)  Resp 18  Ht 6\' 5"  (1.956 m)  Wt 210 lb (95.255 kg)  BMI 24.90 kg/m2  SpO2 98%  8:10 PM Unfortunately we do not have Lyrica here.   Will discharge patient to home with plan for follow-up with his neurologist as planned in 2 weeks. Pain meds for home.  Patient urged to return with worsening symptoms or other concerns. Patient verbalized understanding and agrees with plan.   Patient counseled on use of narcotic pain medications. Counseled not to combine these medications with others containing tylenol. Urged not to drink alcohol, drive, or perform any other activities that requires focus while taking these medications. The patient verbalizes understanding and agrees with the plan.   MDM   Final diagnoses:  Peripheral neuropathy   Patient with chronic peripheral neuropathy of his bilateral feet. He has had extensive workup for this and is seeing a neurologist in 2 weeks. Blood sugar here is normal. He was prescribed Lyrica today and he plans on filling this and taking it. Patient encouraged to follow-up  as arranged by his podiatrist.   Carlisle Cater, PA-C 08/05/15 2011  Ripley Fraise, MD 08/05/15 2358

## 2015-08-11 ENCOUNTER — Encounter: Payer: BLUE CROSS/BLUE SHIELD | Admitting: Vascular Surgery

## 2015-08-11 ENCOUNTER — Encounter (HOSPITAL_COMMUNITY): Payer: BLUE CROSS/BLUE SHIELD

## 2015-08-24 ENCOUNTER — Ambulatory Visit (INDEPENDENT_AMBULATORY_CARE_PROVIDER_SITE_OTHER): Payer: BLUE CROSS/BLUE SHIELD | Admitting: Allergy and Immunology

## 2015-08-24 ENCOUNTER — Encounter: Payer: Self-pay | Admitting: Allergy and Immunology

## 2015-08-24 VITALS — BP 100/56 | HR 76 | Temp 98.3°F | Resp 20 | Ht 75.79 in | Wt 210.8 lb

## 2015-08-24 DIAGNOSIS — T7800XA Anaphylactic reaction due to unspecified food, initial encounter: Secondary | ICD-10-CM | POA: Diagnosis not present

## 2015-08-24 DIAGNOSIS — J3089 Other allergic rhinitis: Secondary | ICD-10-CM | POA: Diagnosis not present

## 2015-08-24 NOTE — Patient Instructions (Signed)
  1. Allergen avoidance measures  2. If allergic reaction: Epi-Pen, benadryl, MD / ER  3. Start OTC Rhinocort - one spray each nostril one time per day.  4. Can use OTC antihistamine (Claritin / Zyrtec)  5. Get fall flu vaccine  6. Contact me if problem  7. Blood tests?

## 2015-08-24 NOTE — Progress Notes (Signed)
[place]    NEW PATIENT NOTE  Subjective:   Patient ID: Mark Morton is a 39 y.o. male with a chief complaint of Allergic Reaction  and the following problems:  HPI Comments: . Mark Morton developed the sensation of throat tightness and constriction within minutes of eating raw carrots approximately one month ago. He was administered benadryl by the fire department and soon after developed vomiting. He had no other associated symptoms or signs. He subsequently was transported to the ER where was administered various medications and within two hours all of his symptoms resolved. Mark Morton has noticed that he sometimes developed throat tightness when eating raw carrots and snow peas in the past two years on a infrequent and intermittent basis. These symptoms would only last for a few minutes and he started washing his vegetables.    Mark Morton also has problems with congestion of his nose occuring daily without variation for as long as he can remember. Occasionally during the spring he gets some associated sniffing and rhinorrhea. Rarely does he use any medications for this issue.   Past Medical History  Diagnosis Date  . Allergy   . Anxiety   . Depression   . Chronic kidney disease     Kidney stone    Past Surgical History  Procedure Laterality Date  . Lumbar disc surgery    . Cystoscopy with retrograde pyelogram, ureteroscopy and stent placement Left 06/19/2015    Procedure: CYSTOSCOPY WITH RETROGRADE PYELOGRAM, LEFT URETEROSCOPY, STONE BASKETRY AND LEFT JJ STENT PLACEMENT;  Surgeon: Carolan Clines, MD;  Location: [facility];  Service: Urology;  Laterality: Left;  . Back surgery  1996     Current outpatient prescriptions:  .  EPINEPHrine 0.3 mg/0.3 mL IJ SOAJ injection, Inject 0.3 mLs (0.3 mg total) into the muscle once., Disp: 1 Device, Rfl: 2 .  fluticasone (FLONASE) 50 MCG/ACT nasal spray, Place 2 sprays into the nose daily.,  Disp: 16 g, Rfl: 1 .  Pregabalin (LYRICA PO), Take by mouth., Disp: , Rfl:  .  sildenafil (VIAGRA) 100 MG tablet, Take 100 mg by mouth daily as needed for erectile dysfunction., Disp: , Rfl:  .  HYDROcodone-acetaminophen (NORCO/VICODIN) 5-325 MG per tablet, Take 1-2 tablets every 6 hours as needed for severe pain (Patient not taking: Reported on 08/24/2015), Disp: 8 tablet, Rfl: 0 .  meloxicam (MOBIC) 15 MG tablet, Take 15 mg by mouth daily., Disp: , Rfl: 0 .  methocarbamol (ROBAXIN) 500 MG tablet, 1 tablet as needed., Disp: , Rfl: 0  No orders of the defined types were placed in this encounter.    No Known Allergies  Review of Systems  Constitutional: Negative for fever, chills and weight loss.  HENT: Negative for congestion, ear pain, hearing loss, nosebleeds, sore throat and tinnitus.   Eyes: Negative for redness.  Respiratory: Negative for cough, sputum production, shortness of breath, wheezing and stridor.   Cardiovascular: Negative for chest pain and leg swelling.  Gastrointestinal: Positive for vomiting. Negative for heartburn, nausea, abdominal pain and diarrhea.  Genitourinary: Negative.   Musculoskeletal:       Has lower extremity buring  Skin: Negative for itching and rash.  Neurological: Negative for dizziness and headaches.    Family History  Problem Relation Age of Onset  . Cancer Maternal Grandmother   . Diabetes Maternal Grandfather     Social History   Social History  . Marital Status: Married    Spouse Name: N/A  .  Number of Children: N/A  . Years of Education: N/A   Occupational History  . Not on file.   Social History Main Topics  . Smoking status: Never Smoker   . Smokeless tobacco: Never Used  . Alcohol Use: Yes     Comment: occ  . Drug Use: No  . Sexual Activity: Not on file   Other Topics Concern  . Not on file   Social History Narrative    Environmental History: Pets in the home: cats (1). Flooring: hardwood floors Climate Control:  central or room air conditioning Dust Mite Controls: Dust mite controls are not in place. Tobacco Smoke in Home: no    Objective:   Filed Vitals:   08/24/15 1340  BP: 100/56  Pulse: 76  Temp: 98.3 F (36.8 C)  Resp: 20    Physical Exam  Constitutional: He is well-developed, well-nourished, and in no distress.  HENT:  Head: Normocephalic and atraumatic.  Right Ear: Tympanic membrane and external ear normal.  Left Ear: Tympanic membrane and external ear normal.  Nose: Mucosal edema present. No rhinorrhea, nose lacerations, sinus tenderness or septal deviation. No epistaxis.  Mouth/Throat: Oropharynx is clear and moist. No oropharyngeal exudate.  Eyes: Conjunctivae are normal. Pupils are equal, round, and reactive to light. Right eye exhibits no discharge. Left eye exhibits no discharge. No scleral icterus.  Neck: No JVD present. No tracheal deviation present. No thyromegaly present.  Cardiovascular: Normal rate, regular rhythm and normal heart sounds.  Exam reveals no gallop and no friction rub.   No murmur heard. Pulmonary/Chest: Effort normal. No stridor. No respiratory distress. He has no wheezes. He has no rales. He exhibits no tenderness.  Musculoskeletal: He exhibits no edema or tenderness.  Lymphadenopathy:    He has no cervical adenopathy.  Skin: No rash noted. No erythema. No pallor.    Diagnostics: Allergy tests were performed. He demonstrated positives to trees and no foods.   .    Assessment and Plan:   1. Allergy with anaphylaxis due to food, initial encounter   2. Other allergic rhinitis         1. Allergen avoidance measures  2. If allergic reaction: Epi-Pen, benadryl, MD / ER  3. Start OTC Rhinocort - one spray each nostril one time per day.  4. Can use OTC antihistamine (Claritin / Zyrtec)  5. Get fall flu vaccine  6. Contact me if problem  This may be an issue with oral pollonosis syndrome with associated systemic reaction. I gave him  information about the cross reactive proteins and avoidance measures. He will follow up in this clinic in one year or earlier if there is a problem.     Allena Katz, MD [facility]  Cc: Ulanda Edison, MD

## 2015-09-28 ENCOUNTER — Encounter: Payer: Self-pay | Admitting: Diagnostic Neuroimaging

## 2015-09-28 ENCOUNTER — Ambulatory Visit (INDEPENDENT_AMBULATORY_CARE_PROVIDER_SITE_OTHER): Payer: BLUE CROSS/BLUE SHIELD | Admitting: Diagnostic Neuroimaging

## 2015-09-28 VITALS — BP 104/69 | HR 69 | Ht 77.0 in | Wt 210.6 lb

## 2015-09-28 DIAGNOSIS — M79672 Pain in left foot: Secondary | ICD-10-CM

## 2015-09-28 DIAGNOSIS — R208 Other disturbances of skin sensation: Secondary | ICD-10-CM | POA: Diagnosis not present

## 2015-09-28 DIAGNOSIS — R292 Abnormal reflex: Secondary | ICD-10-CM | POA: Diagnosis not present

## 2015-09-28 DIAGNOSIS — R200 Anesthesia of skin: Secondary | ICD-10-CM

## 2015-09-28 DIAGNOSIS — M79671 Pain in right foot: Secondary | ICD-10-CM | POA: Diagnosis not present

## 2015-09-28 NOTE — Patient Instructions (Signed)
Thank you for coming to see [place] at [facility]. I hope we have been able to provide you high quality care today.  You may receive a patient satisfaction survey over the next few weeks. We would appreciate your feedback and comments so that we may continue to improve ourselves and the health of our patients.  - will check MRI lumbar spine and lab testing - will repeat EMG/NCS (electrical nerve test)   ~~~~~~~~~~~~~~~~~~~~~~~~~~~~~~~~~~~~~~~~~~~~~~~~~~~~~~~~~~~~~~~~~  DR. Kingsly Kloepfer'S GUIDE TO HAPPY AND HEALTHY LIVING These are some of my general health and wellness recommendations. Some of them may apply to you better than others. Please use common sense as you try these suggestions and feel free to ask me any questions.   ACTIVITY/FITNESS Mental, social, emotional and physical stimulation are very important for brain and body health. Try learning a new activity (arts, music, language, sports, games).  Keep moving your body to the best of your abilities. You can do this at home, inside or outside, the park, community center, gym or anywhere you like. Consider a physical therapist or personal trainer to get started. Consider the app Sworkit. Fitness trackers such as smart-watches, smart-phones or Fitbits can help as well.   NUTRITION Eat more plants: colorful vegetables, nuts, seeds and berries.  Eat less sugar, salt, preservatives and processed foods.  Avoid toxins such as cigarettes and alcohol.  Drink water when you are thirsty. Warm water with a slice of lemon is an excellent morning drink to start the day.  Consider these websites for more information The Nutrition Source (https://www.henry-hernandez.biz/) Precision Nutrition (WindowBlog.ch)   RELAXATION Consider practicing mindfulness meditation or other relaxation techniques such as deep breathing, prayer, yoga, tai chi, massage. See website mindful.org or the apps  Headspace or Calm to help get started.   SLEEP Try to get at least 7-8+ hours sleep per day. Regular exercise and reduced caffeine will help you sleep better. Practice good sleep hygeine techniques. See website sleep.org for more information.   PLANNING Prepare estate planning, living will, healthcare POA documents. Sometimes this is best planned with the help of an attorney. Theconversationproject.org and agingwithdignity.org are excellent resources.

## 2015-09-28 NOTE — Progress Notes (Signed)
[facility]  PATIENT: Mark Morton DOB: 1976/10/09  REFERRING CLINICIAN: Joya Gaskins HISTORY FROM: patient  REASON FOR VISIT: new consult     HISTORICAL  CHIEF COMPLAINT:  Chief Complaint  Patient presents with  . Peripheral neuropathy    rm 7, New patient    HISTORY OF PRESENT ILLNESS:   39 year old male here for evaluation of numbness and tingling feet. For past 2 years patient has had mild tingling in bilateral toes and feet. Over past 2 months this has suddenly increased burning sensation. Also he had noticed some weakness in his hands. Patient went outside neurologist who checked EMG nerve conduction study which apparently showed neuropathy. He had some blood testing done which apparently were unremarkable. He was treated for carpal tunnel syndrome with hand splints and treated with epidural steroid injection for suspected lumbar radiculopathy.  Patient requests a second opinion with me for further evaluation.  Patient has been on Lyrica 100 mg twice a day with mild relief of painful symptoms. 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 He has remote history of lumbar radiculopathy radiating to the right leg status post discectomy in 1996.    REVIEW OF SYSTEMS: Full 14 system review of systems performed and notable only for numbness weakness increased thirst allergies.  ALLERGIES: Allergies  Allergen Reactions  . Carrot [Daucus Carota]     HOME MEDICATIONS: Outpatient Prescriptions Prior to Visit  Medication Sig Dispense Refill  . Pregabalin (LYRICA PO) Take by mouth.    . sildenafil (VIAGRA) 100 MG tablet Take 100 mg by mouth Mark as needed for erectile dysfunction.    Marland Kitchen EPINEPHrine 0.3 mg/0.3 mL IJ SOAJ injection Inject 0.3 mLs (0.3 mg total) into the muscle once. (Patient not taking: Reported on 09/28/2015) 1 Device 2  . HYDROcodone-acetaminophen (NORCO/VICODIN) 5-325 MG  per tablet Take 1-2 tablets every 6 hours as needed for severe pain (Patient not taking: Reported on 08/24/2015) 8 tablet 0  . meloxicam (MOBIC) 15 MG tablet Take 15 mg by mouth Mark.  0  . methocarbamol (ROBAXIN) 500 MG tablet 1 tablet as needed.  0  . fluticasone (FLONASE) 50 MCG/ACT nasal spray Place 2 sprays into the nose Mark. 16 g 1   No facility-administered medications prior to visit.    PAST MEDICAL HISTORY: Past Medical History  Diagnosis Date  . Allergy   . Anxiety   . Depression   . Chronic kidney disease     Kidney stone    PAST SURGICAL HISTORY: Past Surgical History  Procedure Laterality Date  . Lumbar disc surgery    . Cystoscopy with retrograde pyelogram, ureteroscopy and stent placement Left 06/19/2015    Procedure: CYSTOSCOPY WITH RETROGRADE PYELOGRAM, LEFT URETEROSCOPY, STONE BASKETRY AND LEFT JJ STENT PLACEMENT;  Surgeon: Carolan Clines, MD;  Location: [facility];  Service: Urology;  Laterality: Left;  . Back surgery  1996    FAMILY HISTORY: Family History  Problem Relation Age of Onset  . Cancer Maternal Grandmother   . Diabetes Maternal Grandfather   . Hypertension Maternal Grandfather   . Neuropathy Father     SOCIAL HISTORY:  Social History   Social History  . Marital Status: Married    Spouse Name: Stanton Kidney  . Number of Children: 0  . Years of Education: 16   Occupational History  .      carpentry   Social History Main Topics  . Smoking status:  Never Smoker   . Smokeless tobacco: Never Used  . Alcohol Use: No     Comment: occ  . Drug Use: No  . Sexual Activity: Not on file   Other Topics Concern  . Not on file   Social History Narrative   Lives at home with wife   No caffeine      PHYSICAL EXAM  GENERAL EXAM/CONSTITUTIONAL: Vitals:  Filed Vitals:   09/28/15 0822  BP: 104/69  Pulse: 69  Height: 6\' 5"  (1.956 m)  Weight: 210 lb 9.6 oz (95.528 kg)     Body mass index is 24.97 kg/(m^2).  Visual Acuity Screening   Right  eye Left eye Both eyes  Without correction: 20/30 20/30   With correction:        Patient is in no distress; well developed, nourished and groomed; neck is supple  CARDIOVASCULAR:  Examination of carotid arteries is normal; no carotid bruits  Regular rate and rhythm, no murmurs  Examination of peripheral vascular system by observation and palpation is normal  EYES:  Ophthalmoscopic exam of optic discs and posterior segments is normal; no papilledema or hemorrhages  MUSCULOSKELETAL:  Gait, strength, tone, movements noted in Neurologic exam below  NEUROLOGIC: MENTAL STATUS:  No flowsheet data found.  awake, alert, oriented to person, place and time  recent and remote memory intact  normal attention and concentration  language fluent, comprehension intact, naming intact,   fund of knowledge appropriate  CRANIAL NERVE:   2nd - no papilledema on fundoscopic exam  2nd, 3rd, 4th, 6th - pupils equal and reactive to light, visual fields full to confrontation, extraocular muscles intact, no nystagmus  5th - facial sensation symmetric  7th - facial strength symmetric  8th - hearing intact  9th - palate elevates symmetrically, uvula midline  11th - shoulder shrug symmetric  12th - tongue protrusion midline  MOTOR:   normal bulk and tone, full strength in the BUE, BLE  SENSORY:   normal and symmetric to light touch, temperature, vibration; DECR PP IN BOTTOM OF FEET  COORDINATION:   finger-nose-finger, fine finger movements normal  REFLEXES:   deep tendon reflexes --> BUE TRACE; BLE ABSENT; DOWN GOING TOES  GAIT/STATION:   narrow based gait; able to walk on toes, heels and tandem; romberg is negative    DIAGNOSTIC DATA (LABS, IMAGING, TESTING) - I reviewed patient records, labs, notes, testing and imaging myself where available.  Lab Results  Component Value Date   WBC 9.2 06/16/2015   HGB 13.9 06/18/2015   HCT 41.0 06/18/2015   MCV 92.5  06/16/2015   PLT 215 06/16/2015      Component Value Date/Time   NA 138 06/18/2015 1027   K 3.6 06/18/2015 1027   CL 102 06/18/2015 1027   CO2 27 06/16/2015 1555   GLUCOSE 97 06/18/2015 1027   BUN 18 06/18/2015 1027   CREATININE 1.30* 06/18/2015 1027   CREATININE 1.15 10/23/2012 2106   CALCIUM 9.3 06/16/2015 1555   PROT 6.9 10/23/2012 2106   ALBUMIN 4.6 10/23/2012 2106   AST 22 10/23/2012 2106   ALT 28 10/23/2012 2106   ALKPHOS 66 10/23/2012 2106   BILITOT 0.5 10/23/2012 2106   GFRNONAA >60 06/16/2015 1555   GFRAA >60 06/16/2015 1555   Lab Results  Component Value Date   CHOL 219* 10/23/2012   HDL 26* 10/23/2012   LDLCALC  10/23/2012     Comment:       Not calculated due to Triglyceride >400. Suggest  ordering Direct LDL (Unit Code: [phone]).   Total Cholesterol/HDL Ratio:CHD Risk                        Coronary Heart Disease Risk Table                                        Men       Women          1/2 Average Risk              3.4        3.3              Average Risk              5.0        4.4           2X Average Risk              9.6        7.1           3X Average Risk             23.4       11.0 Use the calculated Patient Ratio above and the CHD Risk table  to determine the patient's CHD Risk. ATP III Classification (LDL):       < 100        mg/dL         Optimal      100 - 129     mg/dL         Near or Above Optimal      130 - 159     mg/dL         Borderline High      160 - 189     mg/dL         High       > 190        mg/dL         Very High     TRIG 658* 10/23/2012   CHOLHDL 8.4 10/23/2012   No results found for: HGBA1C No results found for: VITAMINB12 Lab Results  Component Value Date   TSH 1.506 06/22/2013    08/19/15 EMG/NCS (Dr. Trula Ore) - I reviewed right data and EMG nerve conduction report. Abnormalities in right median, ulnar, sural sensory responses noted. Abnormalities in right median, ulnar and peroneal motor response is also  noted. 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 Also mentioned are moderate right median neuropathy at the wrist and multilevel lumbosacral radiculopathy. No abnormal spontaneous activity noted. Decreased motor unit recruitment noted in all muscles tested of right upper and lower extremities. Paraspinal muscles were not assessed.  OUTSIDE LABS: Lab testing including serum protein electrophoresis and immunofixation, TSH, B12, folate, and vitamin D were negative.    ASSESSMENT AND PLAN  39 y.o. year old male here with new onset lower extremity numbness tingling burning sensation over past 2 years with subacute increased 2 months ago. Symptoms affecting upper and lower extremities. Will check additional workup.  Ddx: neuropathy (autoimmune, inflamm, demyelinating, toxic, metabolic, hereditary) vs lumbar radiculopathy vs carpal tunnel syndrome  Numbness in feet - Plan: MR Lumbar Spine Wo Contrast, Neuropathy Panel, GM1 Antibody IgG, IgM, HIV antibody (with reflex), RPR, Angiotensin converting enzyme, B. burgdorfi antibodies,  CBC with Differential/Platelet, Comprehensive metabolic panel, NCV with EMG(electromyography)  Pain in both feet - Plan: MR Lumbar Spine Wo Contrast, Neuropathy Panel, GM1 Antibody IgG, IgM, HIV antibody (with reflex), RPR, Angiotensin converting enzyme, B. burgdorfi antibodies, CBC with Differential/Platelet, Comprehensive metabolic panel, NCV with EMG(electromyography)  Areflexia - Plan: MR Lumbar Spine Wo Contrast, Neuropathy Panel, GM1 Antibody IgG, IgM, HIV antibody (with reflex), RPR, Angiotensin converting enzyme, B. burgdorfi antibodies, CBC with Differential/Platelet, Comprehensive metabolic panel, NCV with EMG(electromyography)     PLAN:  Orders Placed This Encounter  Procedures  . MR Lumbar Spine Wo Contrast  . Neuropathy Panel  . GM1 Antibody IgG, IgM  . HIV antibody (with reflex)  . RPR   . Angiotensin converting enzyme  . B. burgdorfi antibodies  . CBC with Differential/Platelet  . Comprehensive metabolic panel  . NCV with EMG(electromyography)   Return in about 1 month (around 10/28/2015).    Penni Bombard, MD 99/01/5700, 7:79 AM Certified in Neurology, Neurophysiology and Neuroimaging  [facility] [address], [place], [place] [address] [phone]

## 2015-09-29 ENCOUNTER — Ambulatory Visit (INDEPENDENT_AMBULATORY_CARE_PROVIDER_SITE_OTHER): Payer: BLUE CROSS/BLUE SHIELD

## 2015-09-29 DIAGNOSIS — M79672 Pain in left foot: Secondary | ICD-10-CM | POA: Diagnosis not present

## 2015-09-29 DIAGNOSIS — R292 Abnormal reflex: Secondary | ICD-10-CM

## 2015-09-29 DIAGNOSIS — R200 Anesthesia of skin: Secondary | ICD-10-CM

## 2015-09-29 DIAGNOSIS — R208 Other disturbances of skin sensation: Secondary | ICD-10-CM | POA: Diagnosis not present

## 2015-09-29 DIAGNOSIS — M79671 Pain in right foot: Secondary | ICD-10-CM | POA: Diagnosis not present

## 2015-10-07 ENCOUNTER — Telehealth: Payer: Self-pay | Admitting: *Deleted

## 2015-10-07 NOTE — Telephone Encounter (Signed)
Per Dr Leta Baptist, spoke with patient and informed him that all labs except one are back with normal results. Informed him that the pending lab was sent to a specialty lab, and he will receive a call when results are back. Also informed him that his MRI showed very mild stenosis, but basically a normal MRI result, per Dr Leta Baptist. Patient stated he would like more explanation of "what I don't have", and will discuss with Dr Leta Baptist at his next appointment. He verbalized understanding of above, appreciation for call.

## 2015-10-12 LAB — CBC WITH DIFFERENTIAL/PLATELET
BASOS ABS: 0 10*3/uL (ref 0.0–0.2)
Basos: 1 %
EOS (ABSOLUTE): 0.1 10*3/uL (ref 0.0–0.4)
Eos: 3 %
HEMOGLOBIN: 15 g/dL (ref 12.6–17.7)
Hematocrit: 44.2 % (ref 37.5–51.0)
IMMATURE GRANS (ABS): 0 10*3/uL (ref 0.0–0.1)
Immature Granulocytes: 0 %
LYMPHS: 38 %
Lymphocytes Absolute: 1.7 10*3/uL (ref 0.7–3.1)
MCH: 32.2 pg (ref 26.6–33.0)
MCHC: 33.9 g/dL (ref 31.5–35.7)
MCV: 95 fL (ref 79–97)
Monocytes Absolute: 0.5 10*3/uL (ref 0.1–0.9)
Monocytes: 10 %
Neutrophils Absolute: 2.1 10*3/uL (ref 1.4–7.0)
Neutrophils: 48 %
Platelets: 240 10*3/uL (ref 150–379)
RBC: 4.66 x10E6/uL (ref 4.14–5.80)
RDW: 13.8 % (ref 12.3–15.4)
WBC: 4.4 10*3/uL (ref 3.4–10.8)

## 2015-10-12 LAB — NEUROPATHY PANEL
A/G Ratio: 1.4 (ref 0.7–1.7)
ALBUMIN ELP: 4.1 g/dL (ref 2.9–4.4)
Alpha 1: 0.2 g/dL (ref 0.0–0.4)
Alpha 2: 0.6 g/dL (ref 0.4–1.0)
Angio Convert Enzyme: 20 U/L (ref 14–82)
Anti Nuclear Antibody(ANA): NEGATIVE
Beta: 1 g/dL (ref 0.7–1.3)
GAMMA GLOBULIN: 1.1 g/dL (ref 0.4–1.8)
Globulin, Total: 3 g/dL (ref 2.2–3.9)
Sed Rate: 2 mm/hr (ref 0–15)
TSH: 0.695 u[IU]/mL (ref 0.450–4.500)
Total Protein: 7.1 g/dL (ref 6.0–8.5)
VIT D 25 HYDROXY: 29.4 ng/mL — AB (ref 30.0–100.0)
VITAMIN B 12: 329 pg/mL (ref 211–946)

## 2015-10-12 LAB — RPR: RPR Ser Ql: NONREACTIVE

## 2015-10-12 LAB — COMPREHENSIVE METABOLIC PANEL
ALBUMIN: 4.7 g/dL (ref 3.5–5.5)
ALT: 19 IU/L (ref 0–44)
AST: 18 IU/L (ref 0–40)
Albumin/Globulin Ratio: 2 (ref 1.1–2.5)
Alkaline Phosphatase: 61 IU/L (ref 39–117)
BUN / CREAT RATIO: 18 (ref 9–20)
BUN: 20 mg/dL (ref 6–24)
Bilirubin Total: 0.4 mg/dL (ref 0.0–1.2)
CO2: 26 mmol/L (ref 18–29)
CREATININE: 1.14 mg/dL (ref 0.76–1.27)
Calcium: 9.8 mg/dL (ref 8.7–10.2)
Chloride: 96 mmol/L — ABNORMAL LOW (ref 97–106)
GFR calc Af Amer: 92 mL/min/{1.73_m2} (ref >59)
GFR calc non Af Amer: 79 mL/min/{1.73_m2} (ref >59)
GLUCOSE: 80 mg/dL (ref 65–99)
Globulin, Total: 2.4 g/dL (ref 1.5–4.5)
Potassium: 4.3 mmol/L (ref 3.5–5.2)
Sodium: 139 mmol/L (ref 136–144)

## 2015-10-12 LAB — GM1 ANTIBODY IGG, IGM
GM 1 IgG: <1:100 {titer}
GM 1 IgM: <1:100 {titer}

## 2015-10-12 LAB — B. BURGDORFI ANTIBODIES: Lyme IgG/IgM Ab: <0.91 {ISR} (ref 0.00–0.90)

## 2015-10-12 LAB — HIV ANTIBODY (ROUTINE TESTING W REFLEX): HIV Screen 4th Generation wRfx: NONREACTIVE

## 2015-10-13 ENCOUNTER — Telehealth: Payer: Self-pay | Admitting: *Deleted

## 2015-10-13 NOTE — Telephone Encounter (Signed)
Left vm for patient informing him his last lab result is normal. Reminded him of upcoming appointments re: NCS/EMG, FU with Dr Leta Baptist. Informed him this office is closed at 5 pm today until 10/18/15. Left this caller's name, number for questions.

## 2015-10-18 NOTE — Telephone Encounter (Signed)
Patient called to cancel 10/28/15 EMG/NCS appointment. Wants to keep follow up appointment with Dr. Stark Klein 11/09/15. Would like for Dr. Leta Baptist to get results of recent NCS/EMG at [facility]. Prior to 11/09/15 appointment.

## 2015-10-18 NOTE — Telephone Encounter (Signed)
Spoke with patient and informed him he must sign release of information for Dr Leta Baptist to get EMG results from [facility]. He states he will come by and sign. EMG was done in [place] location.

## 2015-10-20 ENCOUNTER — Encounter: Payer: BLUE CROSS/BLUE SHIELD | Admitting: Diagnostic Neuroimaging

## 2015-10-20 NOTE — Telephone Encounter (Signed)
Faxed release of info to [facility] requesting EMG/NCS results.

## 2015-10-28 ENCOUNTER — Encounter: Payer: BLUE CROSS/BLUE SHIELD | Admitting: Diagnostic Neuroimaging

## 2015-10-29 ENCOUNTER — Emergency Department (HOSPITAL_COMMUNITY)
Admission: EM | Admit: 2015-10-29 | Discharge: 2015-10-29 | Disposition: A | Discharge status: Home / Self Care | Payer: 59 | Attending: Emergency Medicine | Admitting: Emergency Medicine

## 2015-10-29 ENCOUNTER — Encounter (HOSPITAL_COMMUNITY): Payer: Self-pay | Admitting: *Deleted

## 2015-10-29 DIAGNOSIS — N189 Chronic kidney disease, unspecified: Secondary | ICD-10-CM | POA: Diagnosis not present

## 2015-10-29 DIAGNOSIS — T781XXA Other adverse food reactions, not elsewhere classified, initial encounter: Secondary | ICD-10-CM | POA: Diagnosis not present

## 2015-10-29 DIAGNOSIS — X58XXXA Exposure to other specified factors, initial encounter: Secondary | ICD-10-CM | POA: Insufficient documentation

## 2015-10-29 DIAGNOSIS — Z79899 Other long term (current) drug therapy: Secondary | ICD-10-CM | POA: Insufficient documentation

## 2015-10-29 DIAGNOSIS — Z87442 Personal history of urinary calculi: Secondary | ICD-10-CM | POA: Diagnosis not present

## 2015-10-29 DIAGNOSIS — Y998 Other external cause status: Secondary | ICD-10-CM | POA: Diagnosis not present

## 2015-10-29 DIAGNOSIS — R209 Unspecified disturbances of skin sensation: Secondary | ICD-10-CM | POA: Insufficient documentation

## 2015-10-29 DIAGNOSIS — Y9289 Other specified places as the place of occurrence of the external cause: Secondary | ICD-10-CM | POA: Diagnosis not present

## 2015-10-29 DIAGNOSIS — Y9389 Activity, other specified: Secondary | ICD-10-CM | POA: Insufficient documentation

## 2015-10-29 DIAGNOSIS — Z791 Long term (current) use of non-steroidal anti-inflammatories (NSAID): Secondary | ICD-10-CM | POA: Insufficient documentation

## 2015-10-29 DIAGNOSIS — Z8659 Personal history of other mental and behavioral disorders: Secondary | ICD-10-CM | POA: Insufficient documentation

## 2015-10-29 DIAGNOSIS — T7840XA Allergy, unspecified, initial encounter: Secondary | ICD-10-CM

## 2015-10-29 LAB — CBC WITH DIFFERENTIAL/PLATELET
BASOS ABS: 0 10*3/uL (ref 0.0–0.1)
BASOS PCT: 0 %
EOS PCT: 2 %
Eosinophils Absolute: 0.1 10*3/uL (ref 0.0–0.7)
HEMATOCRIT: 42.4 % (ref 39.0–52.0)
Hemoglobin: 14.3 g/dL (ref 13.0–17.0)
Lymphocytes Relative: 24 %
Lymphs Abs: 0.9 10*3/uL (ref 0.7–4.0)
MCH: 31.9 pg (ref 26.0–34.0)
MCHC: 33.7 g/dL (ref 30.0–36.0)
MCV: 94.6 fL (ref 78.0–100.0)
MONO ABS: 0.2 10*3/uL (ref 0.1–1.0)
MONOS PCT: 5 %
Neutro Abs: 2.6 10*3/uL (ref 1.7–7.7)
Neutrophils Relative %: 69 %
PLATELETS: 210 10*3/uL (ref 150–400)
RBC: 4.48 MIL/uL (ref 4.22–5.81)
RDW: 13.2 % (ref 11.5–15.5)
WBC: 3.7 10*3/uL — ABNORMAL LOW (ref 4.0–10.5)

## 2015-10-29 LAB — BASIC METABOLIC PANEL
ANION GAP: 7 (ref 5–15)
BUN: 16 mg/dL (ref 6–20)
CALCIUM: 9.6 mg/dL (ref 8.9–10.3)
CO2: 28 mmol/L (ref 22–32)
CREATININE: 1.24 mg/dL (ref 0.61–1.24)
Chloride: 106 mmol/L (ref 101–111)
GFR calc Af Amer: >60 mL/min (ref >60)
Glucose, Bld: 104 mg/dL — ABNORMAL HIGH (ref 65–99)
Potassium: 3.8 mmol/L (ref 3.5–5.1)
Sodium: 141 mmol/L (ref 135–145)

## 2015-10-29 MED ORDER — DIPHENHYDRAMINE HCL 25 MG PO TABS
50.0000 mg | ORAL_TABLET | Freq: Four times a day (QID) | ORAL | Status: DC | PRN
Start: 2015-10-29 — End: 2021-12-12

## 2015-10-29 MED ORDER — EPINEPHRINE 0.3 MG/0.3ML IJ SOAJ: 0.3000 mg | Freq: Once | INTRAMUSCULAR | Status: DC

## 2015-10-29 MED ORDER — SODIUM CHLORIDE 0.9 % IV BOLUS (SEPSIS)
1000.0000 mL | Freq: Once | INTRAVENOUS | Status: AC
  Administered 2015-10-29: 1000 mL via INTRAVENOUS

## 2015-10-29 MED ORDER — FAMOTIDINE 20 MG PO TABS: 20.0000 mg | ORAL_TABLET | Freq: Every day | ORAL | Status: DC

## 2015-10-29 MED ORDER — PREDNISONE 10 MG PO TABS: 40.0000 mg | ORAL_TABLET | Freq: Every day | ORAL | Status: DC

## 2015-10-29 NOTE — ED Provider Notes (Signed)
CSN: [id number]     Arrival date & time 10/29/15  0800 History   First MD Initiated Contact with Patient 10/29/15 [phone]     Chief Complaint  Patient presents with  . Allergic Reaction     (Consider location/radiation/quality/duration/timing/severity/associated sxs/prior Treatment) HPI 39 year old male who presents with an allergic reaction. He has a history of oral allergy syndrome, and has had severe allergic reaction to carrots in the past. States that he had a mixed assortment foods this morning, including mixed nuts, eggs, avocado, and homemade sulci including tomatoes, peppers, onion, cumin, limit juice, well-appearing is, and salon troponin at around 6:40 AM. While driving into work, he began to notice a "funny sensation" throughout his body that was reminiscent of when he had his serious allergic reaction in the past. About 7:25 AM he took a single dose of prednisone with the Benadryl. He continues to have this sensation and gave himself an EpiPen. States that with his serious allergic reaction last time to carrots, he had the sensation of throat constriction, which she did not have today. Denies any throat or intraoral swelling, difficulty breathing, syncope, nausea or vomiting, abdominal pain, chest pain, diarrhea. States that he otherwise has been in his usual state of health. States that currently he feels sleepy, but his prior symptoms had resolved.  Past Medical History  Diagnosis Date  . Allergy   . Anxiety   . Depression   . Chronic kidney disease     Kidney stone   Past Surgical History  Procedure Laterality Date  . Lumbar disc surgery    . Cystoscopy with retrograde pyelogram, ureteroscopy and stent placement Left 06/19/2015    Procedure: CYSTOSCOPY WITH RETROGRADE PYELOGRAM, LEFT URETEROSCOPY, STONE BASKETRY AND LEFT JJ STENT PLACEMENT;  Surgeon: Carolan Clines, MD;  Location: [facility];  Service: Urology;  Laterality: Left;  . Back surgery  1996   Family History   Problem Relation Age of Onset  . Cancer Maternal Grandmother   . Diabetes Maternal Grandfather   . Hypertension Maternal Grandfather   . Neuropathy Father    Social History  Substance Use Topics  . Smoking status: Never Smoker   . Smokeless tobacco: Never Used  . Alcohol Use: No     Comment: occ    Review of Systems 10/14 systems reviewed and are negative other than those stated in the HPI    Allergies  Carrot  Home Medications   Prior to Admission medications   Medication Sig Start Date End Date Taking? Authorizing Provider  EPINEPHrine 0.3 mg/0.3 mL IJ SOAJ injection Inject 0.3 mLs (0.3 mg total) into the muscle once. 07/28/15  Yes Nat Christen, MD  HYDROcodone-acetaminophen (NORCO/VICODIN) 5-325 MG per tablet Take 1-2 tablets every 6 hours as needed for severe pain Patient taking differently: Take 1-2 tablets by mouth every 6 (six) hours as needed for moderate pain.  08/05/15  Yes Carlisle Cater, PA-C  meloxicam (MOBIC) 15 MG tablet Take 15 mg by mouth daily. 07/08/15  Yes Historical Provider, MD  methocarbamol (ROBAXIN) 500 MG tablet Take 500 mg by mouth every 8 (eight) hours as needed for muscle spasms.  07/08/15  Yes Historical Provider, MD  Multiple Vitamin (MULTIVITAMIN) capsule Take 1 capsule by mouth daily.   Yes Historical Provider, MD  pregabalin (LYRICA) 100 MG capsule Take 100 mg by mouth 2 (two) times daily.   Yes Historical Provider, MD  sildenafil (VIAGRA) 100 MG tablet Take 100 mg by mouth daily as needed for erectile dysfunction.  Yes Historical Provider, MD  diphenhydrAMINE (BENADRYL) 25 MG tablet Take 2 tablets (50 mg total) by mouth every 6 (six) hours as needed for itching or allergies. 10/29/15   Forde Dandy, MD  EPINEPHrine 0.3 mg/0.3 mL IJ SOAJ injection Inject 0.3 mLs (0.3 mg total) into the muscle once. 10/29/15   Forde Dandy, MD  famotidine (PEPCID) 20 MG tablet Take 1 tablet (20 mg total) by mouth daily. 10/29/15   Forde Dandy, MD  predniSONE (DELTASONE)  10 MG tablet Take 4 tablets (40 mg total) by mouth daily. 10/30/15   Forde Dandy, MD   BP 112/66 mmHg  Pulse 70  Temp(Src) 97.6 F (36.4 C) (Oral)  Resp 17  SpO2 99% Physical Exam Physical Exam  Nursing note and vitals reviewed. Constitutional: Well developed, well nourished, non-toxic, and in no acute distress Head: Normocephalic and atraumatic.  Mouth/Throat: Oropharynx is clear and moist.  Neck: Normal range of motion. Neck supple.  Cardiovascular: Normal rate and regular rhythm.   Pulmonary/Chest: Effort normal and breath sounds normal.  Abdominal: Soft. There is no tenderness. There is no rebound and no guarding.  Musculoskeletal: Normal range of motion.  Neurological: Alert, no facial droop, fluent speech, moves all extremities symmetrically Skin: Skin is warm and dry.  Psychiatric: Cooperative  ED Course  Procedures (including critical care time) Labs Review Labs Reviewed  CBC WITH DIFFERENTIAL/PLATELET - Abnormal; Notable for the following:    WBC 3.7 (*)    All other components within normal limits  BASIC METABOLIC PANEL - Abnormal; Notable for the following:    Glucose, Bld 104 (*)    All other components within normal limits    Imaging Review No results found.    I have personally reviewed and evaluated these images and lab results as part of my medical decision-making.    MDM   Final diagnoses:  Allergic reaction, initial encounter    39 year old male who presents with possible allergic reaction. On presentation, he is well-appearing and in no acute distress. Vital signs are within normal limits. States that he currently is asymptomatic, and has no evidence of acute allergic reaction currently. Is observed in the emergency department without recurrence of allergic symptoms. Has unremarkable blood work. We have reviewed indications for giving himself an EpiPen. Given that this is similar to when he had a serious allergic reaction in the past, he is given a  steroid burst, Pepcid, and Benadryl as needed. After observation, he is felt appropriate for discharge home. Strict return follow-up instructions are reviewed. He expressed understanding of all discharge instructions and felt comfortable with the plan of care.  Forde Dandy, MD 10/29/15 [phone]

## 2015-10-29 NOTE — ED Notes (Signed)
Pt had previous allergic reaction in September to carrots, was seen in ED. No previous food allergies. No hx of intubations from allergic reactions. Last allergic reaction pt had "all over funny body feeling sensation and then his throat started constricting".  Today 12/9 at 0640 pt ate: egg, avocado, homemade salsa (tomatoes, peppers, onion, cumen, lemon juice, jalepenos peppers, and cilatnro). At 0645 pt had mixed nuts (with at least walnuts and cashews). Pt has been eating cashews a lot lately. At 0700 pt started having "all over funny body sensation", started feeling like throat might be constricting. At 725 took rx of 1 time dose of prednisone and 2 benadryl tablets. Feeling remained, so pt gave himself epi pen injection at 0735. Pt does not have throat constriction feeling. Feels shaky. Denies SOB. Denies pain.

## 2015-10-29 NOTE — Discharge Instructions (Signed)
Return without fail for worsening symptoms including difficulty breathing, sensation of throat swelling or swelling within her mouth, feeling extremely lightheaded or passing out, or any other symptoms concerning to you.  Allergies An allergy is an abnormal reaction to a substance by the body's defense system (immune system). Allergies can develop at any age. WHAT CAUSES ALLERGIES? An allergic reaction happens when the immune system mistakenly reacts to a normally harmless substance, called an allergen, as if it were harmful. The immune system releases antibodies to fight the substance. Antibodies eventually release a chemical called histamine into the bloodstream. The release of histamine is meant to protect the body from infection, but it also causes discomfort. An allergic reaction can be triggered by:  Eating an allergen.  Inhaling an allergen.  Touching an allergen. WHAT TYPES OF ALLERGIES ARE THERE? There are many types of allergies. Common types include:  Seasonal allergies. People with this type of allergy are usually allergic to substances that are only present during certain seasons, such as molds and pollens.  Food allergies.  Drug allergies.  Insect allergies.  Animal dander allergies. WHAT ARE SYMPTOMS OF ALLERGIES? Possible allergy symptoms include:  Swelling of the lips, face, tongue, mouth, or throat.  Sneezing, coughing, or wheezing.  Nasal congestion.  Tingling in the mouth.  Rash.  Itching.  Itchy, red, swollen areas of skin (hives).  Watery eyes.  Vomiting.  Diarrhea.  Dizziness.  Lightheadedness.  Fainting.  Trouble breathing or swallowing.  Chest tightness.  Rapid heartbeat. HOW ARE ALLERGIES DIAGNOSED? Allergies are diagnosed with a medical and family history and one or more of the following:  Skin tests.  Blood tests.  A food diary. A food diary is a record of all the foods and drinks you have in a day and of all the symptoms  you experience.  The results of an elimination diet. An elimination diet involves eliminating foods from your diet and then adding them back in one by one to find out if a certain food causes an allergic reaction. HOW ARE ALLERGIES TREATED? There is no cure for allergies, but allergic reactions can be treated with medicine. Severe reactions usually need to be treated at a hospital. HOW CAN REACTIONS BE PREVENTED? The best way to prevent an allergic reaction is by avoiding the substance you are allergic to. Allergy shots and medicines can also help prevent reactions in some cases. People with severe allergic reactions may be able to prevent a life-threatening reaction called anaphylaxis with a medicine given right after exposure to the allergen.   This information is not intended to replace advice given to you by your health care provider. Make sure you discuss any questions you have with your health care provider.   Document Released: 01/30/2003 Document Revised: 11/27/2014 Document Reviewed: 08/18/2014 Elsevier Interactive Patient Education [employer].

## 2015-11-05 ENCOUNTER — Encounter (HOSPITAL_COMMUNITY): Payer: Self-pay

## 2015-11-05 ENCOUNTER — Emergency Department (HOSPITAL_COMMUNITY)
Admission: EM | Admit: 2015-11-05 | Discharge: 2015-11-05 | Disposition: A | Discharge status: Home / Self Care | Payer: 59 | Attending: Emergency Medicine | Admitting: Emergency Medicine

## 2015-11-05 ENCOUNTER — Emergency Department (HOSPITAL_COMMUNITY): Payer: 59

## 2015-11-05 DIAGNOSIS — Z791 Long term (current) use of non-steroidal anti-inflammatories (NSAID): Secondary | ICD-10-CM | POA: Insufficient documentation

## 2015-11-05 DIAGNOSIS — Z8659 Personal history of other mental and behavioral disorders: Secondary | ICD-10-CM | POA: Insufficient documentation

## 2015-11-05 DIAGNOSIS — Z79899 Other long term (current) drug therapy: Secondary | ICD-10-CM | POA: Insufficient documentation

## 2015-11-05 DIAGNOSIS — F458 Other somatoform disorders: Secondary | ICD-10-CM | POA: Insufficient documentation

## 2015-11-05 DIAGNOSIS — R0989 Other specified symptoms and signs involving the circulatory and respiratory systems: Secondary | ICD-10-CM

## 2015-11-05 DIAGNOSIS — N189 Chronic kidney disease, unspecified: Secondary | ICD-10-CM | POA: Insufficient documentation

## 2015-11-05 DIAGNOSIS — Z87442 Personal history of urinary calculi: Secondary | ICD-10-CM | POA: Diagnosis not present

## 2015-11-05 DIAGNOSIS — R198 Other specified symptoms and signs involving the digestive system and abdomen: Secondary | ICD-10-CM

## 2015-11-05 DIAGNOSIS — R1310 Dysphagia, unspecified: Secondary | ICD-10-CM | POA: Diagnosis present

## 2015-11-05 MED ORDER — PREDNISONE 20 MG PO TABS
60.0000 mg | ORAL_TABLET | Freq: Every day | ORAL | Status: DC
  Administered 2015-11-05: 60 mg via ORAL
  Filled 2015-11-05: qty 3

## 2015-11-05 MED ORDER — EPINEPHRINE 0.3 MG/0.3ML IJ SOAJ
0.3000 mg | Freq: Once | INTRAMUSCULAR | Status: AC
  Administered 2015-11-05: 0.3 mg via INTRAMUSCULAR
  Filled 2015-11-05: qty 0.3

## 2015-11-05 NOTE — ED Notes (Signed)
PT STATES HE FEELS MUCH BETTER. PO CHALLENGE GIVEN WITH WATER. PT TOLERATED WELL.

## 2015-11-05 NOTE — ED Notes (Signed)
Bed: WA15 Expected date:  Expected time:  Means of arrival:  Comments: Triage 2  

## 2015-11-05 NOTE — ED Notes (Signed)
INITIAL ASSESSMENT COMPLETED. PT C/O HAVING AN ALLERGIC REACTION WHILE EATING BREAKFAST. PT STATES HIS THROAT FELT AS IF IT WAS "CLOSING-UP".  PT STATES THIS HAS HAPPENED MULTIPLE TIMES IN THE PAST COUPLE OF MONTHS. AWAITING FURTHER ORDERS.

## 2015-11-05 NOTE — ED Notes (Addendum)
Patient c/o swelling in throat.  States that was seen at [facility] in [place] yesterday for same.  Patient states that ate breakfast this AM at 0930 and swelling has returned. Patient states that swelling has gradually gotten worse since this morning.  Stated ate Eggs, mushroom, onions, and sausage for breakfast (all cooked) and has not eaten anything since today. Denies difficulty breathing, Denies tongue swelling.  Patient states that took benydryl, prednisone, and famotidine today.  Patient has recently been prescribed Lyrica within the last 6 moths for neuropathy.  Patient states that has had 3 reactions in the last week.  Patient denies pain.  Breathing even and unlabored, patient is talking and speech is clear.

## 2015-11-05 NOTE — Consult Note (Signed)
Mark Morton, [place] [id number] [birth year] Gareth Morgan, MD  Reason for Consult: subjective globus sensation  HPI: 39yo with oral allergy syndrome and food allergies who has had several weeks of globus sensation and feeling like his "throat is closing up". He came to the ER and was given epinephrine and feels much better but ER wanted him to have laryngoscopy to rule out laryngeal swelling. He denies dysphagia and he has no stridor and is lying comfortably in ER bed.  Allergies:  Allergies  Allergen Reactions  . Carrot [Daucus Carota] Shortness Of Breath    Can eat cooked carrots     ROS: + for globus, throat fullness, otherwise negative x 12 systems except per HPI.  PMH:  Past Medical History  Diagnosis Date  . Allergy   . Anxiety   . Depression   . Chronic kidney disease     Kidney stone    FH:  Family History  Problem Relation Age of Onset  . Cancer Maternal Grandmother   . Diabetes Maternal Grandfather   . Hypertension Maternal Grandfather   . Neuropathy Father     SH:  Social History   Social History  . Marital Status: Married    Spouse Name: Stanton Kidney  . Number of Children: 0  . Years of Education: 16   Occupational History  .      carpentry   Social History Main Topics  . Smoking status: Never Smoker   . Smokeless tobacco: Never Used  . Alcohol Use: No     Comment: occ  . Drug Use: No  . Sexual Activity: Not on file   Other Topics Concern  . Not on file   Social History Narrative   Lives at home with wife   No caffeine     PSH:  Past Surgical History  Procedure Laterality Date  . Lumbar disc surgery    . Cystoscopy with retrograde pyelogram, ureteroscopy and stent placement Left 06/19/2015    Procedure: CYSTOSCOPY WITH RETROGRADE PYELOGRAM, LEFT URETEROSCOPY, STONE BASKETRY AND LEFT JJ STENT PLACEMENT;  Surgeon: Carolan Clines, MD;  Location: [facility];  Service: Urology;  Laterality: Left;  . Back surgery  1996    Physical  Exam: CN 2-12 grossly  intact and symmetric. Normal voice. EAC/TMs normal BL. Oral cavity, lips, gums, ororpharynx normal with no masses or lesions. Skin warm and dry. Nasal cavity without polyps or purulence. External nose and ears without masses or lesions. EOMI, PERRLA. Neck supple with no masses or lesions. No lymphadenopathy palpated. Thyroid normal with no masses palpated.  Procedure Note: 31575 Informed verbal consent was obtained after explaining the risks (including bleeding and infection), benefits and alternatives of the procedure. Verbal timeout was performed prior to the procedure. The nose was topicalized with topical surgilube. The 30mm flexible scope was advanced through the right and left nasal cavity. The septum and turbinates appeared normal. The middle meatus was free of polyps or purulence. The eustachian tube, choana, and adenoids were normal in appearance. The hypopharynx, arytenoids, false vocal folds, and true vocal folds appeared normal with no masses or edema and normal adduction and abduction bilaterally. The visualized portion of the subglottis appeared normal. The patient tolerated the procedure with no immediate complications.  A/P: completely normal laryngoscopic exam with no nasal, pharyngeal, oral, or laryngeal edema. He can follow up with his allergist. Can use the typical allergic reaction medications, H1 and H2 blockers/antihistamines, prednisone taper, and epi pen as needed.   Ruby Cola 11/05/2015 10:21 PM

## 2015-11-05 NOTE — ED Notes (Signed)
ENT @ [facility].

## 2015-11-05 NOTE — ED Provider Notes (Signed)
CSN: [id number]     Arrival date & time 11/05/15  1542 History   First MD Initiated Contact with Patient 11/05/15 1613     Chief Complaint  Patient presents with  . Allergic Reaction     (Consider location/radiation/quality/duration/timing/severity/associated sxs/prior Treatment) HPI Comments: Today ate breakfast around 9AM Feels like throat closing No other symptoms [place] yesterday for same, improved   Patient is a 39 y.o. male presenting with allergic reaction.  Allergic Reaction Presenting symptoms: no difficulty swallowing and no rash   Context: food   Relieved by:  Nothing Worsened by:  Nothing tried Ineffective treatments:  None tried   Past Medical History  Diagnosis Date  . Allergy   . Anxiety   . Depression   . Chronic kidney disease     Kidney stone   Past Surgical History  Procedure Laterality Date  . Lumbar disc surgery    . Cystoscopy with retrograde pyelogram, ureteroscopy and stent placement Left 06/19/2015    Procedure: CYSTOSCOPY WITH RETROGRADE PYELOGRAM, LEFT URETEROSCOPY, STONE BASKETRY AND LEFT JJ STENT PLACEMENT;  Surgeon: Carolan Clines, MD;  Location: [facility];  Service: Urology;  Laterality: Left;  . Back surgery  1996   Family History  Problem Relation Age of Onset  . Cancer Maternal Grandmother   . Diabetes Maternal Grandfather   . Hypertension Maternal Grandfather   . Neuropathy Father    Social History  Substance Use Topics  . Smoking status: Never Smoker   . Smokeless tobacco: Never Used  . Alcohol Use: No     Comment: occ    Review of Systems  Constitutional: Negative for fever.  HENT: Negative for sore throat, trouble swallowing and voice change.   Eyes: Negative for visual disturbance.  Respiratory: Negative for shortness of breath.   Cardiovascular: Negative for chest pain.  Gastrointestinal: Negative for nausea, vomiting and abdominal pain.  Genitourinary: Negative for difficulty urinating.  Musculoskeletal:  Negative for back pain and neck stiffness.  Skin: Negative for rash.  Neurological: Negative for syncope and headaches.      Allergies  Carrot  Home Medications   Prior to Admission medications   Medication Sig Start Date End Date Taking? Authorizing Provider  diphenhydrAMINE (BENADRYL) 25 MG tablet Take 2 tablets (50 mg total) by mouth every 6 (six) hours as needed for itching or allergies. 10/29/15  Yes Forde Dandy, MD  EPINEPHrine 0.3 mg/0.3 mL IJ SOAJ injection Inject 0.3 mLs (0.3 mg total) into the muscle once. 10/29/15  Yes Forde Dandy, MD  famotidine (PEPCID) 20 MG tablet Take 1 tablet (20 mg total) by mouth daily. 10/29/15  Yes Forde Dandy, MD  predniSONE (DELTASONE) 10 MG tablet 6 tabs daily x2 days, 5 tabs daily x2 days, 4 tabs daily x2 days, 3 tabs daily x2 days, 2 tabs daily x2 days, 1 tablet daily x2 days 11/04/15 11/14/15 Yes Historical Provider, MD  pregabalin (LYRICA) 100 MG capsule Take 100 mg by mouth 2 (two) times daily.   Yes Historical Provider, MD  EPINEPHrine 0.3 mg/0.3 mL IJ SOAJ injection Inject 0.3 mLs (0.3 mg total) into the muscle once. Patient not taking: Reported on 11/05/2015 07/28/15   Nat Christen, MD  HYDROcodone-acetaminophen (NORCO/VICODIN) 5-325 MG per tablet Take 1-2 tablets every 6 hours as needed for severe pain Patient taking differently: Take 1-2 tablets by mouth every 6 (six) hours as needed for moderate pain.  08/05/15   Carlisle Cater, PA-C  meloxicam (MOBIC) 15 MG tablet Take 15 mg  by mouth daily. 07/08/15   Historical Provider, MD  predniSONE (DELTASONE) 10 MG tablet Take 4 tablets (40 mg total) by mouth daily. Patient not taking: Reported on 11/05/2015 10/30/15   Forde Dandy, MD  sildenafil (VIAGRA) 100 MG tablet Take 100 mg by mouth daily as needed for erectile dysfunction.    Historical Provider, MD   BP 104/72 mmHg  Pulse 49  Temp(Src) 97.7 F (36.5 C) (Oral)  Resp 15  SpO2 96% Physical Exam  Constitutional: He is oriented to person,  place, and time. He appears well-developed and well-nourished. No distress.  HENT:  Head: Normocephalic and atraumatic.  Mouth/Throat: Oropharynx is clear and moist. No oropharyngeal exudate.  Eyes: Conjunctivae and EOM are normal.  Neck: Normal range of motion.  Cardiovascular: Normal rate, regular rhythm, normal heart sounds and intact distal pulses.  Exam reveals no gallop and no friction rub.   No murmur heard. Pulmonary/Chest: Effort normal and breath sounds normal. No respiratory distress. He has no wheezes. He has no rales.  Abdominal: Soft. He exhibits no distension. There is no tenderness. There is no guarding.  Musculoskeletal: He exhibits no edema.  Neurological: He is alert and oriented to person, place, and time.  Skin: Skin is warm and dry. He is not diaphoretic.  Nursing note and vitals reviewed.   ED Course  Procedures (including critical care time) Labs Review Labs Reviewed - No data to display  Imaging Review No results found. I have personally reviewed and evaluated these images and lab results as part of my medical decision-making.   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  Patient without other signs of anaphylaxis including no nausea vomiting, no lightheadedness no vital sign abnormalities, no rash, and has no sign of clear sign of oro pharyngeal swelling, no stridor, dysphonia or dysphasia, however given patient's history of similar prior reactions and symptoms, we'll treat for anaphylaxis with 0.3 mg of epinephrine IM. Patient reports taking 4 25 mg tablets of Benadryl prior to arrival to the emergency department as well as famotidine. He is given 60 mg of prednisone and observed in the emergency department for 4 hours  after receiving epinephrine.   Patient reported that he initially improved following epinephrine, however then developed this symptom of throat tightening again. Denies again any other anaphylactic symptoms including no nausea vomiting, no lightheadednessc no change in vital signs, no rashc no shortness of breath.  Patient does not have stridor, no dysphonia or dysphasia, however given patient's frequent presentation, and frequent use of epinephrine for the symptoms with question of possible laryngeal swelling, consult to the ear nose and throat for worsening symptoms.  Ear nose and throat performed scope at bedside without sign of laryngeal edema despite patient's ongoing symptoms.  Symptoms unlikely at this time to be secondary to ongoing anaphylaxis, and question other etiology of globus sensation. Recommend close follow-up with patient's allergist, and continuing the Benadryl and prednisone as prescribed during his ED visit yesterday.    Gareth Morgan, MD 11/06/15 [id number]

## 2015-11-09 ENCOUNTER — Ambulatory Visit (INDEPENDENT_AMBULATORY_CARE_PROVIDER_SITE_OTHER): Payer: 59 | Admitting: Diagnostic Neuroimaging

## 2015-11-09 ENCOUNTER — Encounter: Payer: Self-pay | Admitting: Diagnostic Neuroimaging

## 2015-11-09 VITALS — BP 102/65 | HR 64 | Ht 77.0 in | Wt 206.0 lb

## 2015-11-09 DIAGNOSIS — R292 Abnormal reflex: Secondary | ICD-10-CM | POA: Diagnosis not present

## 2015-11-09 DIAGNOSIS — G629 Polyneuropathy, unspecified: Secondary | ICD-10-CM | POA: Diagnosis not present

## 2015-11-09 DIAGNOSIS — R200 Anesthesia of skin: Secondary | ICD-10-CM

## 2015-11-09 DIAGNOSIS — M79671 Pain in right foot: Secondary | ICD-10-CM | POA: Diagnosis not present

## 2015-11-09 DIAGNOSIS — R208 Other disturbances of skin sensation: Secondary | ICD-10-CM | POA: Diagnosis not present

## 2015-11-09 DIAGNOSIS — M79672 Pain in left foot: Secondary | ICD-10-CM | POA: Diagnosis not present

## 2015-11-09 MED ORDER — PREGABALIN 150 MG PO CAPS: 150.0000 mg | ORAL_CAPSULE | Freq: Two times a day (BID) | ORAL | Status: DC

## 2015-11-09 NOTE — Patient Instructions (Addendum)
Thank you for coming to see [place] at [facility]. I hope we have been able to provide you high quality care today.  You may receive a patient satisfaction survey over the next few weeks. We would appreciate your feedback and comments so that we may continue to improve ourselves and the health of our patients.  - I will check additional testing - increase lyrica to 171m three times per day or 1534mtwice a day   ~~~~~~~~~~~~~~~~~~~~~~~~~~~~~~~~~~~~~~~~~~~~~~~~~~~~~~~~~~~~~~~~~  DR. Elanda Garmany'S GUIDE TO HAPPY AND HEALTHY LIVING These are some of my general health and wellness recommendations. Some of them may apply to you better than others. Please use common sense as you try these suggestions and feel free to ask me any questions.   ACTIVITY/FITNESS Mental, social, emotional and physical stimulation are very important for brain and body health. Try learning a new activity (arts, music, language, sports, games).  Keep moving your body to the best of your abilities. You can do this at home, inside or outside, the park, community center, gym or anywhere you like. Consider a physical therapist or personal trainer to get started. Consider the app Sworkit. Fitness trackers such as smart-watches, smart-phones or Fitbits can help as well.   NUTRITION Eat more plants: colorful vegetables, nuts, seeds and berries.  Eat less sugar, salt, preservatives and processed foods.  Avoid toxins such as cigarettes and alcohol.  Drink water when you are thirsty. Warm water with a slice of lemon is an excellent morning drink to start the day.  Consider these websites for more information The Nutrition Source (hthttps://www.henry-hernandez.biz/[employer]ision Nutrition (wwWindowBlog.ch  RELAXATION Consider practicing mindfulness meditation or other relaxation techniques such as deep breathing, prayer, yoga, tai chi, massage. See website mindful.org or  the apps Headspace or Calm to help get started.   SLEEP Try to get at least 7-8+ hours sleep per day. Regular exercise and reduced caffeine will help you sleep better. Practice good sleep hygeine techniques. See website sleep.org for more information.   PLANNING Prepare estate planning, living will, healthcare POA documents. Sometimes this is best planned with the help of an attorney. Theconversationproject.org and agingwithdignity.org are excellent resources.

## 2015-11-09 NOTE — Progress Notes (Signed)
[facility]  PATIENT: Antoney Holquin DOB: 1976/05/21  REFERRING CLINICIAN: Joya Gaskins HISTORY FROM: patient  REASON FOR VISIT: follow up    HISTORICAL  CHIEF COMPLAINT:  Chief Complaint  Patient presents with  . Numbness of feet    rm 7  . Follow-up    6 weeks    HISTORY OF PRESENT ILLNESS:   UPDATE 11/09/15: Since last visit, having more pain/burning in feet. 2 nights ago, had sudden increase in pain and sxs. Also working with allergist re: food allergies / sensitivities. Also with 2 ER visits for poss allergic reactions. Also with hand abnl sensations (cold).   PRIOR HPI (09/28/15): 39 year old male here for evaluation of numbness and tingling feet. For past 2 years patient has had mild tingling in bilateral toes and feet. Over past 2 months this has suddenly increased burning sensation. Also he had noticed some weakness in his hands. Patient went outside neurologist who checked EMG nerve conduction study which apparently showed neuropathy. He had some blood testing done which apparently were unremarkable. He was treated for carpal tunnel syndrome with hand splints and treated with epidural steroid injection for suspected lumbar radiculopathy. Patient requests a second opinion with me for further evaluation. Patient has been on Lyrica 100 mg twice a day with mild relief of painful symptoms. 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 He has remote history of lumbar radiculopathy radiating to the right leg status post discectomy in 1996.    REVIEW OF SYSTEMS: Full 14 system review of systems performed and notable only for numbness weakness increased thirst allergies.  ALLERGIES: Allergies  Allergen Reactions  . Carrot [Daucus Carota] Shortness Of Breath    Can eat cooked carrots   . Almond (Diagnostic)     Swelling of throat  . Other     Green peppers, swelling of throat, unsure of  this allergy    HOME MEDICATIONS: Outpatient Prescriptions Prior to Visit  Medication Sig Dispense Refill  . diphenhydrAMINE (BENADRYL) 25 MG tablet Take 2 tablets (50 mg total) by mouth every 6 (six) hours as needed for itching or allergies. 20 tablet 0  . EPINEPHrine 0.3 mg/0.3 mL IJ SOAJ injection Inject 0.3 mLs (0.3 mg total) into the muscle once. 1 Device 2  . EPINEPHrine 0.3 mg/0.3 mL IJ SOAJ injection Inject 0.3 mLs (0.3 mg total) into the muscle once. 1 Device 0  . famotidine (PEPCID) 20 MG tablet Take 1 tablet (20 mg total) by mouth daily. 30 tablet 0  . predniSONE (DELTASONE) 10 MG tablet 6 tabs daily x2 days, 5 tabs daily x2 days, 4 tabs daily x2 days, 3 tabs daily x2 days, 2 tabs daily x2 days, 1 tablet daily x2 days    . pregabalin (LYRICA) 100 MG capsule Take 100 mg by mouth 2 (two) times daily.    . sildenafil (VIAGRA) 100 MG tablet Take 100 mg by mouth daily as needed for erectile dysfunction.    . predniSONE (DELTASONE) 10 MG tablet Take 4 tablets (40 mg total) by mouth daily. 16 tablet 0  . HYDROcodone-acetaminophen (NORCO/VICODIN) 5-325 MG per tablet Take 1-2 tablets every 6 hours as needed for severe pain (Patient taking differently: Take 1-2 tablets by mouth every 6 (six) hours as needed for moderate pain. ) 8 tablet 0  . meloxicam (MOBIC) 15 MG tablet Take 15 mg by mouth daily.  0   No facility-administered medications prior  to visit.    PAST MEDICAL HISTORY: Past Medical History  Diagnosis Date  . Allergy   . Anxiety   . Depression   . Chronic kidney disease     Kidney stone    PAST SURGICAL HISTORY: Past Surgical History  Procedure Laterality Date  . Lumbar disc surgery    . Cystoscopy with retrograde pyelogram, ureteroscopy and stent placement Left 06/19/2015    Procedure: CYSTOSCOPY WITH RETROGRADE PYELOGRAM, LEFT URETEROSCOPY, STONE BASKETRY AND LEFT JJ STENT PLACEMENT;  Surgeon: Carolan Clines, MD;  Location: [facility];  Service: Urology;  Laterality:  Left;  . Back surgery  1996    FAMILY HISTORY: Family History  Problem Relation Age of Onset  . Cancer Maternal Grandmother   . Diabetes Maternal Grandfather   . Hypertension Maternal Grandfather   . Neuropathy Father     SOCIAL HISTORY:  Social History   Social History  . Marital Status: Married    Spouse Name: Stanton Kidney  . Number of Children: 0  . Years of Education: 16   Occupational History  .      carpentry   Social History Main Topics  . Smoking status: Never Smoker   . Smokeless tobacco: Never Used  . Alcohol Use: No     Comment: occ  . Drug Use: No  . Sexual Activity: Not on file   Other Topics Concern  . Not on file   Social History Narrative   Lives at home with wife   No caffeine      PHYSICAL EXAM  GENERAL EXAM/CONSTITUTIONAL: Vitals:  Filed Vitals:   11/09/15 1457  BP: 102/65  Pulse: 64  Height: 6\' 5"  (1.956 m)  Weight: 206 lb (93.441 kg)   Body mass index is 24.42 kg/(m^2). No exam data present  Patient is in no distress; well developed, nourished and groomed; neck is supple  CARDIOVASCULAR:  Examination of carotid arteries is normal; no carotid bruits  Regular rate and rhythm, no murmurs  Examination of peripheral vascular system by observation and palpation is normal  EYES:  Ophthalmoscopic exam of optic discs and posterior segments is normal; no papilledema or hemorrhages  MUSCULOSKELETAL:  Gait, strength, tone, movements noted in Neurologic exam below  NEUROLOGIC: MENTAL STATUS:  No flowsheet data found.  awake, alert, oriented to person, place and time  recent and remote memory intact  normal attention and concentration  language fluent, comprehension intact, naming intact,   fund of knowledge appropriate  CRANIAL NERVE:   2nd - no papilledema on fundoscopic exam  2nd, 3rd, 4th, 6th - pupils equal and reactive to light, visual fields full to confrontation, extraocular muscles intact, no nystagmus  5th -  facial sensation symmetric  7th - facial strength symmetric  8th - hearing intact  9th - palate elevates symmetrically, uvula midline  11th - shoulder shrug symmetric  12th - tongue protrusion midline  MOTOR:   normal bulk and tone, full strength in the BUE, BLE  SENSORY:   normal and symmetric to light touch, temperature, vibration; DECR PP IN BOTTOM OF FEET  COORDINATION:   finger-nose-finger, fine finger movements normal  REFLEXES:   deep tendon reflexes --> BUE TRACE; BLE ABSENT; DOWN GOING TOES  GAIT/STATION:   narrow based gait; able to walk on toes, heels and tandem; romberg is negative    DIAGNOSTIC DATA (LABS, IMAGING, TESTING) - I reviewed patient records, labs, notes, testing and imaging myself where available.  Lab Results  Component Value Date   WBC  3.7* 10/29/2015   HGB 14.3 10/29/2015   HCT 42.4 10/29/2015   MCV 94.6 10/29/2015   PLT 210 10/29/2015      Component Value Date/Time   NA 141 10/29/2015 0921   NA 139 09/28/2015 0951   K 3.8 10/29/2015 0921   CL 106 10/29/2015 0921   CO2 28 10/29/2015 0921   GLUCOSE 104* 10/29/2015 0921   GLUCOSE 80 09/28/2015 0951   BUN 16 10/29/2015 0921   BUN 20 09/28/2015 0951   CREATININE 1.24 10/29/2015 0921   CREATININE 1.15 10/23/2012 2106   CALCIUM 9.6 10/29/2015 0921   PROT 7.1 09/28/2015 0951   PROT 6.9 10/23/2012 2106   ALBUMIN 4.7 09/28/2015 0951   ALBUMIN 4.6 10/23/2012 2106   AST 18 09/28/2015 0951   ALT 19 09/28/2015 0951   ALKPHOS 61 09/28/2015 0951   BILITOT 0.4 09/28/2015 0951   BILITOT 0.5 10/23/2012 2106   GFRNONAA >60 10/29/2015 0921   GFRAA >60 10/29/2015 0921   Lab Results  Component Value Date   CHOL 219* 10/23/2012   HDL 26* 10/23/2012   LDLCALC  10/23/2012     Comment:       Not calculated due to Triglyceride >400. Suggest ordering Direct LDL (Unit Code: [phone]).   Total Cholesterol/HDL Ratio:CHD Risk                        Coronary Heart Disease Risk Table                                         Men       Women          1/2 Average Risk              3.4        3.3              Average Risk              5.0        4.4           2X Average Risk              9.6        7.1           3X Average Risk             23.4       11.0 Use the calculated Patient Ratio above and the CHD Risk table  to determine the patient's CHD Risk. ATP III Classification (LDL):       < 100        mg/dL         Optimal      100 - 129     mg/dL         Near or Above Optimal      130 - 159     mg/dL         Borderline High      160 - 189     mg/dL         High       > 190        mg/dL         Very High     TRIG 658* 10/23/2012   CHOLHDL 8.4 10/23/2012   No results found for: HGBA1C Lab Results  Component Value Date   VITAMINB12 329 09/28/2015   Lab Results  Component Value Date   TSH 0.695 09/28/2015    08/19/15 EMG/NCS (Dr. Trula Ore) - I reviewed right data and EMG nerve conduction report. Abnormalities in right median, ulnar, sural sensory responses noted. Abnormalities in right median, ulnar and peroneal motor response is also noted. 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 Also mentioned are moderate right median neuropathy at the wrist and multilevel lumbosacral radiculopathy. No abnormal spontaneous activity noted. Decreased motor unit recruitment noted in all muscles tested of right upper and lower extremities. Paraspinal muscles were not assessed.  OUTSIDE LABS: Lab testing including serum protein electrophoresis and immunofixation, TSH, B12, folate, and vitamin D were negative.    ASSESSMENT AND PLAN  39 y.o. year old male here with new onset lower extremity numbness tingling burning sensation over past 2 years with subacute increased 2 months ago. Symptoms affecting upper and lower extremities. Will check additional workup.   Ddx: neuropathy (CIDP, autoimmune, inflamm, demyelinating, toxic,  metabolic, hereditary) vs CNS pathology  Neuropathy ([place]) - Plan: DG FLUORO GUIDE LUMBAR PUNCTURE, NCV with EMG(electromyography)  Numbness in feet - Plan: DG FLUORO GUIDE LUMBAR PUNCTURE, MR Brain Wo Contrast, NCV with EMG(electromyography)  Pain in both feet - Plan: DG FLUORO GUIDE LUMBAR PUNCTURE, MR Brain Wo Contrast, NCV with EMG(electromyography)  Areflexia - Plan: DG FLUORO GUIDE LUMBAR PUNCTURE, MR Brain Wo Contrast, NCV with EMG(electromyography)     PLAN: I spent 25 minutes of face to face time with patient. Greater than 50% of time was spent in counseling and coordination of care with patient. In summary we discussed:  - check additional testing (MRI brain; EMG/NCS; LP) - increase lyrica to 150mg  BID (or 100mg  TID) - try neuropathy cream  Orders Placed This Encounter  Procedures  . DG FLUORO GUIDE LUMBAR PUNCTURE  . MR Brain Wo Contrast  . NCV with EMG(electromyography)   Return in about 3 months (around 02/07/2016).    Penni Bombard, MD [id number], 0000000 PM Certified in Neurology, Neurophysiology and Neuroimaging  [facility] [address] [place], [place] [address] [phone]

## 2015-11-10 ENCOUNTER — Ambulatory Visit (INDEPENDENT_AMBULATORY_CARE_PROVIDER_SITE_OTHER): Payer: 59

## 2015-11-10 ENCOUNTER — Telehealth: Payer: Self-pay | Admitting: Diagnostic Neuroimaging

## 2015-11-10 DIAGNOSIS — R208 Other disturbances of skin sensation: Secondary | ICD-10-CM

## 2015-11-10 DIAGNOSIS — M79671 Pain in right foot: Secondary | ICD-10-CM | POA: Diagnosis not present

## 2015-11-10 DIAGNOSIS — R292 Abnormal reflex: Secondary | ICD-10-CM | POA: Diagnosis not present

## 2015-11-10 DIAGNOSIS — M79672 Pain in left foot: Secondary | ICD-10-CM

## 2015-11-10 DIAGNOSIS — R200 Anesthesia of skin: Secondary | ICD-10-CM

## 2015-11-10 NOTE — Telephone Encounter (Signed)
Spoke with patient who states he is taking Lyrica 100 mg BID but states it gives no relief. He is aware he may take 100 mg TID or 150 mg BID, states "it isn't helping". He has taken Hydrocodone 5/325 mg, 2 tabs this morning and states "it takes the edge off". He is requesting refill on this medication. He states he continues to have burning in his feet and some in his body. He took a natural vegetarian supplement last night, "InflaMed", took 2 caps. He has never taken this in past. He states "it didn't help with the burning". He completes a course of Prednisone today.   Informed him if Dr Leta Baptist refills Hydrocodone, he will have to pick up script at front desk. Patient scheduled for MRI here this afternoon, advised he can check front desk for script if he has not heard back from this RN by then. Informed him would route his c/o, requests to Dr Leta Baptist and call him back. He verbalized understanding, appreciation.  Saw patient and wife in waiting room prior to patient's MRI. Patient states he also has Oxycodone. He still requests refill on Hydrocodone. Spoke with Dr Leta Baptist who recommends patient increase Lyrica as instructed in FU visit yesterday and wait one week to see results. Spoke with wife and informed her. Informed her of holiday office hours and that dr will be on call. Enc to call for any needs, questions. She verbalized understanding.

## 2015-11-10 NOTE — Telephone Encounter (Signed)
Give medication lyrica more time. -VRP

## 2015-11-10 NOTE — Telephone Encounter (Signed)
Pt called sts the burning which was in his feet yesterday is now over his entire body. He feels like he is sitting in a "lake of fire" which started about 11 when he went to bed last night. He has taken 2 hydrocodone about 10 min ago so it has not has enough time to kick in yet. He did sts he took inflammed which a natural alternative about 10 pm last night but he sts the symptoms had already started to increase. He does not feel the inflammed had anything to do with the symptoms.

## 2015-11-11 ENCOUNTER — Ambulatory Visit (INDEPENDENT_AMBULATORY_CARE_PROVIDER_SITE_OTHER): Payer: 59 | Admitting: Diagnostic Neuroimaging

## 2015-11-11 ENCOUNTER — Encounter (INDEPENDENT_AMBULATORY_CARE_PROVIDER_SITE_OTHER): Payer: Self-pay | Admitting: Diagnostic Neuroimaging

## 2015-11-11 ENCOUNTER — Telehealth: Payer: Self-pay | Admitting: Diagnostic Neuroimaging

## 2015-11-11 DIAGNOSIS — M79672 Pain in left foot: Secondary | ICD-10-CM

## 2015-11-11 DIAGNOSIS — Z0289 Encounter for other administrative examinations: Secondary | ICD-10-CM

## 2015-11-11 DIAGNOSIS — R200 Anesthesia of skin: Secondary | ICD-10-CM

## 2015-11-11 DIAGNOSIS — R292 Abnormal reflex: Secondary | ICD-10-CM

## 2015-11-11 DIAGNOSIS — R208 Other disturbances of skin sensation: Secondary | ICD-10-CM

## 2015-11-11 DIAGNOSIS — G629 Polyneuropathy, unspecified: Secondary | ICD-10-CM

## 2015-11-11 DIAGNOSIS — M79671 Pain in right foot: Secondary | ICD-10-CM

## 2015-11-11 NOTE — Procedures (Signed)
   [facility]  NCS (NERVE CONDUCTION STUDY) WITH EMG (ELECTROMYOGRAPHY) REPORT   STUDY DATE: 11/11/15 PATIENT NAME: Mark Morton DOB: 09-Feb-1976 MRN: [id number]  ORDERING CLINICIAN: Andrey Spearman, MD   TECHNOLOGIST: Laretta Alstrom  ELECTROMYOGRAPHER: Earlean Polka. Penumalli, MD  CLINICAL INFORMATION: 40 year old male numbness and burning in hands and feet.  FINDINGS: NERVE CONDUCTION STUDY: Right median motor response has prolonged distal latency, normal amplitude, normal conduction velocity and normal F-wave latency.  Left peroneal motor responses decreased amplitude, normal conduction velocity and normal F-wave latency.  Left median, bilateral ulnar, right peroneal, bilateral tibial motor responses and F wave latencies are normal.  Right median sensory response has normal amplitude and slow conduction velocity.  Left median, bilateral ulnar, bilateral sural and bilateral peroneal sensory responses are normal.   NEEDLE ELECTROMYOGRAPHY: Needle examination of right upper and left lower extremities: Right flexor carpi radialis, right first dorsal interosseous muscles: Normal Left vastus medialis, left tibialis anterior: Normal Left gastrocnemius: No abnormal spontaneous activity at rest and decreased motor unit recruitment on exertion Left L4-5 and bilateral L5-S1 paraspinal muscles: Normal   IMPRESSION:  Abnormal study demonstrate: 1. Right median neuropathy at the wrist consistent with right carpal tunnel syndrome. 2. Mild evidence of chronic left L5, S1 radiculopathies. 3. No widespread underlying large fiber neuropathy.    INTERPRETING PHYSICIAN:  Penni Bombard, MD Certified in Neurology, Neurophysiology and Neuroimaging  [facility] [address], [place], [place] [address] [phone]

## 2015-11-11 NOTE — Telephone Encounter (Signed)
I called patient. Patient answered, then phone cut off. I left message on VM.   MRI brain normal. EMG shows no evidence of widespread neuropathy. Will cancel LP. May represent small fiber neuropathy (? Celiac disease vs hereditary). Continue pain mgmt.  Penni Bombard, MD [id number], [id number] PM Certified in Neurology, Neurophysiology and Neuroimaging  [facility] [address] [place], [place] [address] [phone]

## 2015-11-17 ENCOUNTER — Telehealth: Payer: Self-pay | Admitting: *Deleted

## 2015-11-17 DIAGNOSIS — M79671 Pain in right foot: Secondary | ICD-10-CM

## 2015-11-17 DIAGNOSIS — G629 Polyneuropathy, unspecified: Secondary | ICD-10-CM

## 2015-11-17 DIAGNOSIS — R200 Anesthesia of skin: Secondary | ICD-10-CM

## 2015-11-17 DIAGNOSIS — R292 Abnormal reflex: Secondary | ICD-10-CM

## 2015-11-17 DIAGNOSIS — M79672 Pain in left foot: Secondary | ICD-10-CM

## 2015-11-17 NOTE — Telephone Encounter (Signed)
Spoke with patient earlier today and informed him that per Dr Gladstone Lighter OV notes, patient is to continue with pain management. Patient stated he would like referral to dr in [place] that specializes in Lyme disease, name of dr unknown to him; he is waiting for e mail to give him name.  He stated that even though he tested negative, he has "talked to several people who told him that the test may not be accurate or that he may have been exposed. He stated that maybe he needed blood test for Lyme disease repeated.  He also stared he would like to possibly have biopsy to determine if he has small fiber neuropathy.  Informed him would route his questions, requests to Dr Leta Baptist and call him back with his response. Informed him that Dr Leta Baptist and this RN will be out of office 11/18/15 - 11/24/15, however there are other RN's who could call him back. He verbalized understanding, appreciation.

## 2015-11-17 NOTE — Telephone Encounter (Signed)
Spoke with patient and informed him of Dr Gladstone Lighter detailed response to his questions. Advised him that if he has not heard back about referral in 10 days - 2 weeks, to call back and check.  He verbalized understanding, appreciation.  Referral to [facility] placed.

## 2015-11-17 NOTE — Telephone Encounter (Addendum)
Pt called and wanted to go over his plan of care again. He is unsure as to what needs to happen next. Please call and advise [phone]

## 2015-11-17 NOTE — Telephone Encounter (Signed)
i can refer to [facility] / neuromuscular dept for small fiber neuropathy eval. He should discuss with PCP about lyme clinic referral. -VRP

## 2015-11-19 ENCOUNTER — Other Ambulatory Visit: Payer: 59

## 2015-11-23 ENCOUNTER — Telehealth: Payer: Self-pay | Admitting: Diagnostic Neuroimaging

## 2015-11-23 NOTE — Telephone Encounter (Signed)
Lyrica Prior Josem Kaufmann has been approved through [employer] [facility]) effective until [id number], or until policy changes or is terminated.  Max quantity allowed on plan is 3 caps daily Ref # [id number]

## 2015-11-23 NOTE — Telephone Encounter (Signed)
Patient is calling and asks if it would be safe for him to fast while taking Pregabalin (Lyrica). Patient states that his pharmacy will not fill his Rx Pregabalin(Lyrica) 150 mg and that it is his understanding it needs pre-authorization from Dr. Leta Baptist. Also, the patient states that he has increase pain and wonders if he can take an increased dosage.  Please call.

## 2015-11-23 NOTE — Telephone Encounter (Signed)
Called pt. Advised Dr Leta Baptist out of office until Thursday. Forwarded message to [facility]. Sent message to Norva Pavlov to look at PA needed for Lyrica. Advised we will call him once I speak to [facility]. He verbalized understanding.

## 2015-11-23 NOTE — Telephone Encounter (Signed)
Patient called to request referral for 2nd opinion regarding Lyme disease to [facility] for Infectious Disease Phone# [phone] Dr. Michel Bickers.

## 2015-11-25 NOTE — Telephone Encounter (Signed)
He should ask PCP re: referral about lyme disease. -VRP

## 2015-11-25 NOTE — Telephone Encounter (Signed)
Spoke with patient and informed him Dr Leta Baptist states he should contact PCP for referral. Patient stated Dr Jacelyn Grip felt this office should make recommendation because pt is being seen here for neuropathy. Pt requesting Dr Leta Baptist make referral. Patient then stated his foot pain "is now a 7". He is taking Lyrica 100 mg tid, Hydrocodone one tab every other day. Advised he may take Lyrica 150 mg bid. He stated he would begin that dosing but it remains a total of 300 mg daily, and he "has not been told he can increase it".  He stated he does not like to take pain meds but may need to take Hydrocodone so he can sleep. Advised there are OTC sleep aids, and he could discuss safety of them with pharmacist. Patient then stated he has not heard from [facility] dept re: referral. At his request, gave him [facility]' number.  Informed him would call him back by office closing tomorrow re: Lyme disease referral, Lyrica dosing. He verbalized understanding, appreciation.

## 2015-11-26 MED ORDER — PREGABALIN 150 MG PO CAPS: 150.0000 mg | ORAL_CAPSULE | Freq: Three times a day (TID) | ORAL | Status: DC

## 2015-11-26 NOTE — Telephone Encounter (Signed)
I called patient. Recommend follow up with [facility] neurology. For pain control, recommend increasing lyrica to 150mg  TID. May consider adding amitriptyline as well. -VRP

## 2015-11-30 ENCOUNTER — Telehealth: Payer: Self-pay | Admitting: Diagnostic Neuroimaging

## 2015-11-30 NOTE — Telephone Encounter (Signed)
Nishawn called and LM on my VM He said Dr Leta Baptist referred him to [facility] and muscular and [facility] called him and wanted to know if they were to call about insurance before doing test or not call insurance and go ahead with the test. Patient wants to know if insurance will take care of the test. Please call patient at [phone]. dg

## 2015-11-30 NOTE — Telephone Encounter (Signed)
Spoke w/Devon in [facility] neuro/neuromuscular dept who states patient's referral  info is scanned in the system but unsure if diagnostic tests is what is requested. This RN then spoke with Crystal, diagnostics dept who stated that if pt is to be seen there, certain documents required. She faxed over copy of required info. Clarified with Dr Leta Baptist that patient is referred for evaluation only, no testing. Called Devon back and informed. She completed information she required and gave appt date/time for patient. Spoke with patient and informed him his apt is with Dr Aviva Kluver, 12/17/15 , 1:15 pm in [place] office. Also advised that per [facility], he'll receive info from [facility] prior to his appt. He verbalized understanding, appreciation.

## 2015-11-30 NOTE — Telephone Encounter (Signed)
Spoke w/patient who states [facility] wants to do small fiber test and is asking for insurance PA. Patient gave contact's number. Advised this RN will call and investigate.

## 2015-12-01 ENCOUNTER — Encounter: Payer: Self-pay | Admitting: Allergy and Immunology

## 2015-12-01 ENCOUNTER — Ambulatory Visit (INDEPENDENT_AMBULATORY_CARE_PROVIDER_SITE_OTHER): Payer: 59 | Admitting: Allergy and Immunology

## 2015-12-01 VITALS — BP 110/70 | HR 75 | Temp 97.5°F | Resp 16

## 2015-12-01 DIAGNOSIS — T781XXD Other adverse food reactions, not elsewhere classified, subsequent encounter: Secondary | ICD-10-CM | POA: Diagnosis not present

## 2015-12-01 DIAGNOSIS — T7800XD Anaphylactic reaction due to unspecified food, subsequent encounter: Secondary | ICD-10-CM | POA: Diagnosis not present

## 2015-12-01 DIAGNOSIS — H1010 Acute atopic conjunctivitis, unspecified eye: Secondary | ICD-10-CM

## 2015-12-01 DIAGNOSIS — J309 Allergic rhinitis, unspecified: Secondary | ICD-10-CM

## 2015-12-01 NOTE — Patient Instructions (Signed)
  1. Allergen avoidance measures  2. If allergic reaction: Epi-Pen, benadryl, MD / ER  3. Start OTC Rhinocort - one spray each nostril one time per day.  4. Can use OTC antihistamine (Claritin / Zyrtec)  5. Return to clinic in one year or earlier

## 2015-12-01 NOTE — Progress Notes (Signed)
[facility]  Follow-up Note  Referring Provider: Vernie Shanks, MD Primary Provider: Anthoney Harada, MD Date of Office Visit: 12/01/2015  Subjective:   Mark Morton is a 40 y.o. male who returns to the [facility] [place] in re-evaluation of the following:  HPI Comments:  Mark Morton returns to this clinic on 12/01/2015 in reevaluation of his food allergy and oral pollinosis syndrome and allergic rhinoconjunctivitis. He had 3 episodes of throat constriction in December for which he was administered epinephrine and what sounds like antihistamines and steroids in the emergency room setting. It should be noted that his last 2 reactions in December occurred while consistently using an antihistamine that was administered from his first reaction. It should also be noted that he had a rhinoscopic evaluation by Dr. Simeon Craft while he was in the emergency room which did not identify any significant swelling of his laryngeal structures at a point in time in which she was symptomatic. There was no obvious provoking factor giving rise to this throat sensation. He did not have any reflux issues associated with this sensation. He did eat multiple cooked and uncooked vegetables prior to these reactions but there was no identifiable pattern in regard to consumption of specific food products and the development of these issues.    Current Outpatient Prescriptions on File Prior to Visit  Medication Sig Dispense Refill  . augmented betamethasone dipropionate (DIPROLENE-AF) 0.05 % cream Reported on 11/25/2015  0  . diphenhydrAMINE (BENADRYL) 25 MG tablet Take 2 tablets (50 mg total) by mouth every 6 (six) hours as needed for itching or allergies. 20 tablet 0  . EPINEPHrine 0.3 mg/0.3 mL IJ SOAJ injection Inject 0.3 mLs (0.3 mg total) into the muscle once. 1 Device 2  . HYDROcodone-acetaminophen (NORCO/VICODIN) 5-325 MG tablet Take 1-2 tablets by mouth  every 6 (six) hours as needed for moderate pain.    . pregabalin (LYRICA) 150 MG capsule Take 1 capsule (150 mg total) by mouth 3 (three) times daily. 60 capsule 5  . sildenafil (VIAGRA) 100 MG tablet Take 100 mg by mouth daily as needed for erectile dysfunction.    Marland Kitchen EPINEPHrine 0.3 mg/0.3 mL IJ SOAJ injection Inject 0.3 mLs (0.3 mg total) into the muscle once. (Patient not taking: Reported on 12/01/2015) 1 Device 0  . famotidine (PEPCID) 20 MG tablet Take 1 tablet (20 mg total) by mouth daily. (Patient not taking: Reported on 12/01/2015) 30 tablet 0   No current facility-administered medications on file prior to visit.    No orders of the defined types were placed in this encounter.    Past Medical History  Diagnosis Date  . Allergy   . Anxiety   . Depression   . Chronic kidney disease     Kidney stone    Past Surgical History  Procedure Laterality Date  . Lumbar disc surgery    . Cystoscopy with retrograde pyelogram, ureteroscopy and stent placement Left 06/19/2015    Procedure: CYSTOSCOPY WITH RETROGRADE PYELOGRAM, LEFT URETEROSCOPY, STONE BASKETRY AND LEFT JJ STENT PLACEMENT;  Surgeon: Carolan Clines, MD;  Location: [facility];  Service: Urology;  Laterality: Left;  . Back surgery  1996    Allergies  Allergen Reactions  . Carrot [Daucus Carota] Shortness Of Breath    Can eat cooked carrots   . Almond (Diagnostic)     Swelling of throat  . Other     Green peppers, swelling of throat, unsure of this allergy  Review of systems negative except as noted in HPI / PMHx or noted below:  Review of Systems  Constitutional: Negative.   HENT: Negative.   Eyes: Negative.   Respiratory: Negative.   Cardiovascular: Negative.   Gastrointestinal: Negative.   Genitourinary: Negative.   Musculoskeletal: Negative.   Skin: Negative.   Neurological: Positive for tingling (Neuropathy treated with Lyrica).  Endo/Heme/Allergies: Negative.   Psychiatric/Behavioral: Negative.       Objective:   Filed Vitals:   12/01/15 0955  BP: 110/70  Pulse: 75  Temp: 97.5 F (36.4 C)  Resp: 16          Physical Exam  Constitutional: He is well-developed, well-nourished, and in no distress. No distress.  HENT:  Head: Normocephalic.  Right Ear: Tympanic membrane, external ear and ear canal normal.  Left Ear: Tympanic membrane, external ear and ear canal normal.  Nose: Nose normal. No mucosal edema or rhinorrhea.  Mouth/Throat: Uvula is midline, oropharynx is clear and moist and mucous membranes are normal. No oropharyngeal exudate.  Eyes: Conjunctivae are normal.  Neck: Trachea normal. No tracheal tenderness present. No tracheal deviation present. No thyromegaly present.  Cardiovascular: Normal rate, regular rhythm, S1 normal, S2 normal and normal heart sounds.   No murmur heard. Pulmonary/Chest: Breath sounds normal. No stridor. No respiratory distress. He has no wheezes. He has no rales.  Musculoskeletal: He exhibits no edema.  Lymphadenopathy:       Head (right side): No tonsillar adenopathy present.       Head (left side): No tonsillar adenopathy present.    He has no cervical adenopathy.    He has no axillary adenopathy.  Neurological: He is alert. Gait normal.  Skin: No rash noted. He is not diaphoretic. No erythema. Nails show no clubbing.  Psychiatric: Mood and affect normal.    Diagnostics: None  Assessment and Plan:   1. Allergy with anaphylaxis due to food, subsequent encounter   2. Oral allergy syndrome, subsequent encounter   3. Allergic rhinoconjunctivitis      1. Allergen avoidance measures  2. If allergic reaction: Epi-Pen, benadryl, MD / ER  3. Start OTC Rhinocort - one spray each nostril one time per day.  4. Can use OTC antihistamine (Claritin / Zyrtec)  5. Return to clinic in one year or earlier   Brockton very well could be having some type of low grade swelling of his laryngeal structures although certainly this does not  appear to be an issue that can be identified by rhinoscopy. I have encouraged him to consistently use an antihistamine on a regular basis and be somewhat careful about consuming raw vegetables with the assumption that he does have oral pollinosis syndrome. He'll keep in contact with me noting his response to this approach and we'll make a decision about how to proceed pending his response. He can use over-the-counter Rhinocort for his allergic rhinitis.      Allena Katz, MD [place]

## 2016-01-25 ENCOUNTER — Ambulatory Visit (INDEPENDENT_AMBULATORY_CARE_PROVIDER_SITE_OTHER): Payer: 59 | Admitting: Diagnostic Neuroimaging

## 2016-01-25 ENCOUNTER — Encounter: Payer: Self-pay | Admitting: Diagnostic Neuroimaging

## 2016-01-25 VITALS — BP 110/72 | HR 60 | Wt 222.0 lb

## 2016-01-25 DIAGNOSIS — G629 Polyneuropathy, unspecified: Secondary | ICD-10-CM | POA: Diagnosis not present

## 2016-01-25 MED ORDER — DULOXETINE HCL 30 MG PO CPEP: 30.0000 mg | ORAL_CAPSULE | Freq: Every day | ORAL | Status: DC

## 2016-01-25 MED ORDER — PREGABALIN 200 MG PO CAPS: 200.0000 mg | ORAL_CAPSULE | Freq: Three times a day (TID) | ORAL | Status: DC

## 2016-01-25 NOTE — Progress Notes (Signed)
[facility]  PATIENT: Mark Morton DOB: Jul 27, 1976  REFERRING CLINICIAN: Joya Gaskins HISTORY FROM: patient  REASON FOR VISIT: follow up    HISTORICAL  CHIEF COMPLAINT:  Chief Complaint  Patient presents with  . Neuropathy    rm 7, "pain in feet, recent sensation of pin pricks in body"  . Follow-up    3 month    HISTORY OF PRESENT ILLNESS:   UPDATE 01/25/16: Since last visit patient continues to have symptoms. Had second opinion with [facility]. Records reviewed and summarized: suspected small fiber; EMG/NCS was repeated at [facility] but results not viewable in system. Patient says that very mild abnl found, but not enough to explain degree of pain.  UPDATE 11/09/15: Since last visit, having more pain/burning in feet. 2 nights ago, had sudden increase in pain and sxs. Also working with allergist re: food allergies / sensitivities. Also with 2 ER visits for poss allergic reactions. Also with hand abnl sensations (cold).   PRIOR HPI (09/28/15): 40 year old male here for evaluation of numbness and tingling feet. For past 2 years patient has had mild tingling in bilateral toes and feet. Over past 2 months this has suddenly increased burning sensation. Also he had noticed some weakness in his hands. Patient went outside neurologist who checked EMG nerve conduction study which apparently showed neuropathy. He had some blood testing done which apparently were unremarkable. He was treated for carpal tunnel syndrome with hand splints and treated with epidural steroid injection for suspected lumbar radiculopathy. Patient requests a second opinion with me for further evaluation. Patient has been on Lyrica 100 mg twice a day with mild relief of painful symptoms. 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 He has remote history of lumbar radiculopathy radiating to the right leg status post discectomy in 1996.    REVIEW  OF SYSTEMS: Full 14 system review of systems performed and notable only for numbness weakness increased thirst allergies.  ALLERGIES: Allergies  Allergen Reactions  . Carrot [Daucus Carota] Shortness Of Breath    Can eat cooked carrots   . Almond (Diagnostic)     Swelling of throat  . Other     Green peppers, swelling of throat, unsure of this allergy    HOME MEDICATIONS: Outpatient Prescriptions Prior to Visit  Medication Sig Dispense Refill  . augmented betamethasone dipropionate (DIPROLENE-AF) 0.05 % cream Reported on 11/25/2015  0  . diphenhydrAMINE (BENADRYL) 25 MG tablet Take 2 tablets (50 mg total) by mouth every 6 (six) hours as needed for itching or allergies. 20 tablet 0  . EPINEPHrine 0.3 mg/0.3 mL IJ SOAJ injection Inject 0.3 mLs (0.3 mg total) into the muscle once. 1 Device 0  . famotidine (PEPCID) 20 MG tablet Take 1 tablet (20 mg total) by mouth daily. 30 tablet 0  . HYDROcodone-acetaminophen (NORCO/VICODIN) 5-325 MG tablet Take 1-2 tablets by mouth every 6 (six) hours as needed for moderate pain.    . pregabalin (LYRICA) 150 MG capsule Take 1 capsule (150 mg total) by mouth 3 (three) times daily. 60 capsule 5  . sildenafil (VIAGRA) 100 MG tablet Take 100 mg by mouth daily as needed for erectile dysfunction.    Marland Kitchen EPINEPHrine 0.3 mg/0.3 mL IJ SOAJ injection Inject 0.3 mLs (0.3 mg total) into the muscle once. 1 Device 2   No facility-administered medications prior to visit.    PAST MEDICAL HISTORY: Past Medical History  Diagnosis Date  .  Allergy   . Anxiety   . Depression   . Chronic kidney disease     Kidney stone    PAST SURGICAL HISTORY: Past Surgical History  Procedure Laterality Date  . Lumbar disc surgery    . Cystoscopy with retrograde pyelogram, ureteroscopy and stent placement Left 06/19/2015    Procedure: CYSTOSCOPY WITH RETROGRADE PYELOGRAM, LEFT URETEROSCOPY, STONE BASKETRY AND LEFT JJ STENT PLACEMENT;  Surgeon: Carolan Clines, MD;  Location: [facility];  Service: Urology;  Laterality: Left;  . Back surgery  1996    FAMILY HISTORY: Family History  Problem Relation Age of Onset  . Cancer Maternal Grandmother   . Diabetes Maternal Grandfather   . Hypertension Maternal Grandfather   . Neuropathy Father     SOCIAL HISTORY:  Social History   Social History  . Marital Status: Married    Spouse Name: Stanton Kidney  . Number of Children: 0  . Years of Education: 16   Occupational History  .      carpentry   Social History Main Topics  . Smoking status: Never Smoker   . Smokeless tobacco: Never Used  . Alcohol Use: No     Comment: occ  . Drug Use: No  . Sexual Activity: Not on file   Other Topics Concern  . Not on file   Social History Narrative   Lives at home with wife   No caffeine      PHYSICAL EXAM  GENERAL EXAM/CONSTITUTIONAL: Vitals:  Filed Vitals:   01/25/16 1310  BP: 110/72  Pulse: 60  Weight: 222 lb (100.699 kg)   Body mass index is 26.32 kg/(m^2). No exam data present  Patient is in no distress; well developed, nourished and groomed; neck is supple  CARDIOVASCULAR:  Examination of carotid arteries is normal; no carotid bruits  Regular rate and rhythm, no murmurs  Examination of peripheral vascular system by observation and palpation is normal  MUSCULOSKELETAL:  Gait, strength, tone, movements noted in Neurologic exam below  NEUROLOGIC: MENTAL STATUS:  No flowsheet data found.  awake, alert, oriented to person, place and time  recent and remote memory intact  normal attention and concentration  language fluent, comprehension intact, naming intact,   fund of knowledge appropriate  CRANIAL NERVE:   2nd - no papilledema on fundoscopic exam  2nd, 3rd, 4th, 6th - pupils equal and reactive to light, visual fields full to confrontation, extraocular muscles intact, no nystagmus  5th - facial sensation symmetric  7th - facial strength symmetric  8th - hearing intact  9th - palate  elevates symmetrically, uvula midline  11th - shoulder shrug symmetric  12th - tongue protrusion midline  MOTOR:   normal bulk and tone, full strength in the BUE, BLE  SENSORY:   normal and symmetric to light touch  COORDINATION:   finger-nose-finger, fine finger movements normal  REFLEXES:   deep tendon reflexes --> BUE TRACE; BLE ABSENT   GAIT/STATION:   narrow based gait     DIAGNOSTIC DATA (LABS, IMAGING, TESTING) - I reviewed patient records, labs, notes, testing and imaging myself where available.  Lab Results  Component Value Date   WBC 3.7* 10/29/2015   HGB 14.3 10/29/2015   HCT 42.4 10/29/2015   MCV 94.6 10/29/2015   PLT 210 10/29/2015      Component Value Date/Time   NA 141 10/29/2015 0921   NA 139 09/28/2015 0951   K 3.8 10/29/2015 0921   CL 106 10/29/2015 0921   CO2 28 10/29/2015  [id number]   GLUCOSE 104* 10/29/2015 0921   GLUCOSE 80 09/28/2015 0951   BUN 16 10/29/2015 0921   BUN 20 09/28/2015 0951   CREATININE 1.24 10/29/2015 0921   CREATININE 1.15 10/23/2012 2106   CALCIUM 9.6 10/29/2015 0921   PROT 7.1 09/28/2015 0951   PROT 6.9 10/23/2012 2106   ALBUMIN 4.7 09/28/2015 0951   ALBUMIN 4.6 10/23/2012 2106   AST 18 09/28/2015 0951   ALT 19 09/28/2015 0951   ALKPHOS 61 09/28/2015 0951   BILITOT 0.4 09/28/2015 0951   BILITOT 0.5 10/23/2012 2106   GFRNONAA >60 10/29/2015 0921   GFRAA >60 10/29/2015 0921   Lab Results  Component Value Date   CHOL 219* 10/23/2012   HDL 26* 10/23/2012   LDLCALC  10/23/2012     Comment:       Not calculated due to Triglyceride >400. Suggest ordering Direct LDL (Unit Code: [phone]).   Total Cholesterol/HDL Ratio:CHD Risk                        Coronary Heart Disease Risk Table                                        Men       Women          1/2 Average Risk              3.4        3.3              Average Risk              5.0        4.4           2X Average Risk              9.6        7.1           3X  Average Risk             23.4       11.0 Use the calculated Patient Ratio above and the CHD Risk table  to determine the patient's CHD Risk. ATP III Classification (LDL):       < 100        mg/dL         Optimal      100 - 129     mg/dL         Near or Above Optimal      130 - 159     mg/dL         Borderline High      160 - 189     mg/dL         High       > 190        mg/dL         Very High     TRIG 658* 10/23/2012   CHOLHDL 8.4 10/23/2012   No results found for: HGBA1C Lab Results  Component Value Date   VITAMINB12 329 09/28/2015   Lab Results  Component Value Date   TSH 0.695 09/28/2015    08/19/15 EMG/NCS (Dr. Trula Ore) - I reviewed right data and EMG nerve conduction report. Abnormalities in right median, ulnar, sural sensory responses noted. Abnormalities in right median, ulnar and peroneal motor response is also  noted. 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 Also mentioned are moderate right median neuropathy at the wrist and multilevel lumbosacral radiculopathy. No abnormal spontaneous activity noted. Decreased motor unit recruitment noted in all muscles tested of right upper and lower extremities. Paraspinal muscles were not assessed.  OUTSIDE LABS: Lab testing including serum protein electrophoresis and immunofixation, TSH, B12, folate, and vitamin D were negative.  11/10/15 MRI brain  - normal  09/29/15 MRI lumbar spine (without) demonstrating 1. At L4-5: facet hypertrophy with mild biforaminal stenosis.  2. Multi-level facet hypertrophy.  09/28/15 LABS - GM1 IgM, HIV, RPR, ACE, lyme ab negative    ASSESSMENT AND PLAN  40 y.o. year old male here with new onset lower extremity numbness tingling burning sensation since 2014. Symptoms affecting upper and lower extremities. Extensive testing unremarkable so far (including 3 neurologists evaluations)  Dx: neuropathy (idiopathic small  fiber)  Neuropathy ([place])   PLAN: - continue lyrica 150mg  TID; may increase to 200mg  TID - continue neuropathy cream - add duloxetine 30mg  after 1 month  Meds ordered this encounter  Medications  . pregabalin (LYRICA) 200 MG capsule    Sig: Take 1 capsule (200 mg total) by mouth 3 (three) times daily.    Dispense:  90 capsule    Refill:  5   Return in about 3 months (around 04/26/2016).    Penni Bombard, MD [id number], [id number] PM Certified in Neurology, Neurophysiology and Neuroimaging  [facility] [address] [place], [place] [address] [phone]

## 2016-01-25 NOTE — Patient Instructions (Addendum)
Thank you for coming to see [place] at [facility]. I hope we have been able to provide you high quality care today.  You may receive a patient satisfaction survey over the next few weeks. We would appreciate your feedback and comments so that we may continue to improve ourselves and the health of our patients.  - increase lyrica to 27m three times per day - add on duloxetine 336mdaily after 1 month   ~~~~~~~~~~~~~~~~~~~~~~~~~~~~~~~~~~~~~~~~~~~~~~~~~~~~~~~~~~~~~~~~~  DR. Agripina Guyette'S GUIDE TO HAPPY AND HEALTHY LIVING These are some of my general health and wellness recommendations. Some of them may apply to you better than others. Please use common sense as you try these suggestions and feel free to ask me any questions.   ACTIVITY/FITNESS Mental, social, emotional and physical stimulation are very important for brain and body health. Try learning a new activity (arts, music, language, sports, games).  Keep moving your body to the best of your abilities. You can do this at home, inside or outside, the park, community center, gym or anywhere you like. Consider a physical therapist or personal trainer to get started. Consider the app Sworkit. Fitness trackers such as smart-watches, smart-phones or Fitbits can help as well.   NUTRITION Eat more plants: colorful vegetables, nuts, seeds and berries.  Eat less sugar, salt, preservatives and processed foods.  Avoid toxins such as cigarettes and alcohol.  Drink water when you are thirsty. Warm water with a slice of lemon is an excellent morning drink to start the day.  Consider these websites for more information The Nutrition Source (hthttps://www.henry-hernandez.biz/[employer]ision Nutrition (wwWindowBlog.ch  RELAXATION Consider practicing mindfulness meditation or other relaxation techniques such as deep breathing, prayer, yoga, tai chi, massage. See website mindful.org or the apps  Headspace or Calm to help get started.   SLEEP Try to get at least 7-8+ hours sleep per day. Regular exercise and reduced caffeine will help you sleep better. Practice good sleep hygeine techniques. See website sleep.org for more information.   PLANNING Prepare estate planning, living will, healthcare POA documents. Sometimes this is best planned with the help of an attorney. Theconversationproject.org and agingwithdignity.org are excellent resources.

## 2016-03-17 ENCOUNTER — Emergency Department (HOSPITAL_COMMUNITY)
Admission: EM | Admit: 2016-03-17 | Discharge: 2016-03-18 | Disposition: A | Discharge status: Home / Self Care | Payer: 59 | Attending: Emergency Medicine | Admitting: Emergency Medicine

## 2016-03-17 ENCOUNTER — Emergency Department (HOSPITAL_COMMUNITY): Payer: 59

## 2016-03-17 ENCOUNTER — Encounter (HOSPITAL_COMMUNITY): Payer: Self-pay | Admitting: Emergency Medicine

## 2016-03-17 DIAGNOSIS — N189 Chronic kidney disease, unspecified: Secondary | ICD-10-CM | POA: Diagnosis not present

## 2016-03-17 DIAGNOSIS — R0789 Other chest pain: Secondary | ICD-10-CM | POA: Diagnosis not present

## 2016-03-17 DIAGNOSIS — Z79899 Other long term (current) drug therapy: Secondary | ICD-10-CM | POA: Insufficient documentation

## 2016-03-17 DIAGNOSIS — F329 Major depressive disorder, single episode, unspecified: Secondary | ICD-10-CM | POA: Diagnosis not present

## 2016-03-17 DIAGNOSIS — E785 Hyperlipidemia, unspecified: Secondary | ICD-10-CM | POA: Diagnosis not present

## 2016-03-17 DIAGNOSIS — R079 Chest pain, unspecified: Secondary | ICD-10-CM | POA: Diagnosis present

## 2016-03-17 LAB — BASIC METABOLIC PANEL WITH GFR
Anion gap: 9 (ref 5–15)
BUN: 17 mg/dL (ref 6–20)
CO2: 27 mmol/L (ref 22–32)
Calcium: 9.7 mg/dL (ref 8.9–10.3)
Chloride: 103 mmol/L (ref 101–111)
Creatinine, Ser: 1.18 mg/dL (ref 0.61–1.24)
GFR calc Af Amer: >60 mL/min
GFR calc non Af Amer: >60 mL/min
Glucose, Bld: 89 mg/dL (ref 65–99)
Potassium: 3.6 mmol/L (ref 3.5–5.1)
Sodium: 139 mmol/L (ref 135–145)

## 2016-03-17 LAB — CBC WITH DIFFERENTIAL/PLATELET
BASOS ABS: 0 10*3/uL (ref 0.0–0.1)
BASOS PCT: 1 %
EOS ABS: 0.1 10*3/uL (ref 0.0–0.7)
Eosinophils Relative: 3 %
HCT: 40.5 % (ref 39.0–52.0)
Hemoglobin: 14 g/dL (ref 13.0–17.0)
Lymphocytes Relative: 45 %
Lymphs Abs: 2 10*3/uL (ref 0.7–4.0)
MCH: 31.4 pg (ref 26.0–34.0)
MCHC: 34.6 g/dL (ref 30.0–36.0)
MCV: 90.8 fL (ref 78.0–100.0)
MONO ABS: 0.3 10*3/uL (ref 0.1–1.0)
MONOS PCT: 6 %
Neutro Abs: 2 10*3/uL (ref 1.7–7.7)
Neutrophils Relative %: 45 %
PLATELETS: 200 10*3/uL (ref 150–400)
RBC: 4.46 MIL/uL (ref 4.22–5.81)
RDW: 13.1 % (ref 11.5–15.5)
WBC: 4.4 10*3/uL (ref 4.0–10.5)

## 2016-03-17 LAB — I-STAT TROPONIN, ED: Troponin i, poc: 0 ng/mL (ref 0.00–0.08)

## 2016-03-17 MED ORDER — FAMOTIDINE 20 MG PO TABS
20.0000 mg | ORAL_TABLET | Freq: Once | ORAL | Status: AC
  Administered 2016-03-18: 20 mg via ORAL
  Filled 2016-03-17: qty 1

## 2016-03-17 MED ORDER — DIPHENHYDRAMINE HCL 25 MG PO CAPS: 50.0000 mg | ORAL_CAPSULE | Freq: Once | ORAL | Status: DC

## 2016-03-17 MED ORDER — PREDNISONE 20 MG PO TABS
60.0000 mg | ORAL_TABLET | Freq: Once | ORAL | Status: AC
  Administered 2016-03-18: 60 mg via ORAL
  Filled 2016-03-17: qty 3

## 2016-03-17 NOTE — ED Notes (Signed)
Per pt, states chest tightness and pressure for 3 days-doesn't know if it is from taking some meds his mom gave him for neuropathy

## 2016-03-18 MED ORDER — DIPHENHYDRAMINE HCL 25 MG PO CAPS
50.0000 mg | ORAL_CAPSULE | Freq: Once | ORAL | Status: AC
  Administered 2016-03-18: 50 mg via ORAL
  Filled 2016-03-18 (×2): qty 2

## 2016-03-18 NOTE — ED Provider Notes (Signed)
CSN: [id number]     Arrival date & time 03/17/16  1816 History   First MD Initiated Contact with Patient 03/17/16 2243     Chief Complaint  Patient presents with  . Chest Pain    HPI Comments: 40 year old male who presents with acute onset of chest pain 3 days ago. He states he took a new holistic medicine 3 days ago and within a couple hours he started having chest pain and throat tightness. He has had multiple episodes of allergic reactions over the past couple months requiring epinephrine and Benadryl. He states it feels similar to his allergic reactions however the chest pain is different. The chest pain feels like pressure, is constant, and does not radiate. Denies personal history of cardiac disease or family history of cardiac disease. Past medical history significant for hyperlipidemia. Patient does not smoke. He took Benadryl yesterday which made his symptoms better. Denies fever, chills, diaphoresis, shortness of breath, abdominal pain, nausea, vomiting, diarrhea, dysuria   Past Medical History  Diagnosis Date  . Allergy   . Anxiety   . Depression   . Chronic kidney disease     Kidney stone   Past Surgical History  Procedure Laterality Date  . Lumbar disc surgery    . Cystoscopy with retrograde pyelogram, ureteroscopy and stent placement Left 06/19/2015    Procedure: CYSTOSCOPY WITH RETROGRADE PYELOGRAM, LEFT URETEROSCOPY, STONE BASKETRY AND LEFT JJ STENT PLACEMENT;  Surgeon: Carolan Clines, MD;  Location: [facility];  Service: Urology;  Laterality: Left;  . Back surgery  1996   Family History  Problem Relation Age of Onset  . Cancer Maternal Grandmother   . Diabetes Maternal Grandfather   . Hypertension Maternal Grandfather   . Neuropathy Father    Social History  Substance Use Topics  . Smoking status: Never Smoker   . Smokeless tobacco: Never Used  . Alcohol Use: No     Comment: occ    Review of Systems  Constitutional: Negative for fever and chills.  HENT:    Throat tightness  Respiratory: Negative for shortness of breath.   Cardiovascular: Positive for chest pain.  Gastrointestinal: Negative for nausea, vomiting, abdominal pain and diarrhea.  Genitourinary: Negative for dysuria.      Allergies  Carrot; Almond (diagnostic); and Other  Home Medications   Prior to Admission medications   Medication Sig Start Date End Date Taking? Authorizing Provider  ALPHA LIPOIC ACID PO Take 1 tablet by mouth daily.   Yes Historical Provider, MD  diphenhydrAMINE (BENADRYL) 25 MG tablet Take 2 tablets (50 mg total) by mouth every 6 (six) hours as needed for itching or allergies. 10/29/15  Yes Forde Dandy, MD  EPINEPHrine 0.3 mg/0.3 mL IJ SOAJ injection Inject 0.3 mLs (0.3 mg total) into the muscle once. 10/29/15  Yes Forde Dandy, MD  pregabalin (LYRICA) 150 MG capsule Take 150 mg by mouth 3 (three) times daily.   Yes Historical Provider, MD  sildenafil (VIAGRA) 100 MG tablet Take 100 mg by mouth daily as needed for erectile dysfunction.   Yes Historical Provider, MD  UNABLE TO FIND Take 1 tablet by mouth daily. Super GLA Botase oil   Yes Historical Provider, MD  DULoxetine (CYMBALTA) 30 MG capsule Take 1 capsule (30 mg total) by mouth daily. Patient not taking: Reported on 03/17/2016 01/25/16   Penni Bombard, MD  famotidine (PEPCID) 20 MG tablet Take 1 tablet (20 mg total) by mouth daily. Patient not taking: Reported on 03/17/2016 10/29/15  Forde Dandy, MD  pregabalin (LYRICA) 200 MG capsule Take 1 capsule (200 mg total) by mouth 3 (three) times daily. Patient not taking: Reported on 03/17/2016 01/25/16   Penni Bombard, MD   BP 101/73 mmHg  Pulse 50  Temp(Src) 97.8 F (36.6 C) (Oral)  Resp 16  SpO2 100%   Physical Exam  Constitutional: He is oriented to person, place, and time. He appears well-developed and well-nourished. No distress.  HENT:  Head: Normocephalic and atraumatic.  Mouth/Throat: Uvula is midline, oropharynx is clear and moist and  mucous membranes are normal.  Eyes: Conjunctivae are normal. Pupils are equal, round, and reactive to light. Right eye exhibits no discharge. Left eye exhibits no discharge. No scleral icterus.  Neck: Normal range of motion.  Cardiovascular: Regular rhythm.  Bradycardia present.  Exam reveals no gallop and no friction rub.   No murmur heard. Pulmonary/Chest: Effort normal and breath sounds normal. No respiratory distress. He has no wheezes. He has no rales. He exhibits no tenderness.  Abdominal: Soft. Bowel sounds are normal. He exhibits no distension and no mass. There is no tenderness. There is no rebound and no guarding.  Neurological: He is alert and oriented to person, place, and time.  Skin: Skin is warm and dry.  Psychiatric: He has a normal mood and affect.    ED Course  Procedures (including critical care time) Labs Review Labs Reviewed  CBC WITH DIFFERENTIAL/PLATELET  BASIC METABOLIC PANEL  Randolm Idol, ED    Imaging Review Dg Chest 2 View  03/17/2016  CLINICAL DATA:  Acute onset of generalized chest tightness. Initial encounter. EXAM: CHEST  2 VIEW COMPARISON:  None. FINDINGS: The lungs are well-aerated and clear. There is no evidence of focal opacification, pleural effusion or pneumothorax. The heart is normal in size; the mediastinal contour is within normal limits. No acute osseous abnormalities are seen. IMPRESSION: No acute cardiopulmonary process seen. Electronically Signed   By: Garald Balding M.D.   On: 03/17/2016 18:58   I have personally reviewed and evaluated these images and lab results as part of my medical decision-making.   EKG Interpretation   Date/Time:  Friday March 17 2016 18:26:39 EDT Ventricular Rate:  63 PR Interval:  148 QRS Duration: 90 QT Interval:  378 QTC Calculation: 387 R Axis:   82 Text Interpretation:  Sinus rhythm Baseline wander in lead(s) V3 Confirmed  by Hazle Coca [phone]) on 03/17/2016 10:25:37 PM      MDM   Final  diagnoses:  Atypical chest pain   40 year old male presenting with chest pain and throat tightness. It's possible that his symptoms are due to an allergic reaction with some anxiety component. Benadryl, prednisone, Pepcid given.  Troponin is 0. EKG sinus bradycardia. Patient states he is normally bradycardic. Chest x-ray negative. Labs are unremarkable. Heart score of 1. He is well-appearing, NAD. Other than bradycardia, vitals are within normal limits. After observation, he is felt appropriate for discharge home. Return precautions given. Patient informed of clinical course, understand medical decision-making process, and agree with plan.   Recardo Evangelist, PA-C 03/18/16 0109  Ripley Fraise, MD 03/18/16 [phone]

## 2016-03-20 ENCOUNTER — Telehealth: Payer: Self-pay | Admitting: Diagnostic Neuroimaging

## 2016-03-20 NOTE — Telephone Encounter (Signed)
Pt sts neuropathy has gotten worse. Pt sts is still takingpregabalin (LYRICA) 150 MG capsule , he has not increased to 200mg  yet. Pt sts he tried to add DULoxetine (CYMBALTA) 30 MG capsule for 2 days but "became anxious, body felt like stomache was overturned, kinda of compressed feeling in chest, doesn't know if that was his body reacting to that medication". He is inquiring if he should try to push thru with these symptoms or what would be suggested. Also last Tuesday and Wednesday (4/25,26/17)pt sts he took an alpha lipoic acid 1/day and super GLA botage oil 300mg  1/day (holistic drug) and his stomach felt like it was overturned, burning in his feet increased, and he it on his hands and face . He is questioning if "he is having an allergic reaction to lyrica, could a person have a reaction after they have been taking it for awhile ." Please call

## 2016-03-21 NOTE — Telephone Encounter (Signed)
Continue lyrica; stop cymbalta and supplements.

## 2016-03-21 NOTE — Telephone Encounter (Signed)
Spoke with patient and informed him of Dr Gladstone Lighter reply, recommendations. He stated yesterday he took total of Lyrica 600 mg during the 24 hours, which is total mg Dr Leta Baptist prescribed in 24 hours.  He stated he continues to have "the burning in his face and feet". He stated he will increase Lyrica to total 600 mg daily" and come for follow up on 04/25/16. He stated at that time he should have good idea of effectiveness of increased Lyrica dose.  Advised him to call sooner if needed. He verbalized understanding of call, appreciation.

## 2016-03-27 NOTE — Telephone Encounter (Signed)
Patient reports worsening of burning on feet that started about 5 days ago. He sts his stomach and chest feels tight.  Pt has appointment on 6/6/ and was requesting to be seen earlier but there are no openings. Pt was advise Dr Leta Baptist was out of the office this week. Advised that the nurse would call

## 2016-03-27 NOTE — Telephone Encounter (Addendum)
Spoke with patient and rescheduled for first available FU, 04/18/16.  He stated he is taking Lyrica total 600 mg/day but stated he does not feel it is very helpful. He stated he has discussed his stomach issue with pcp who told him "he didn't really know what to do, deferred it to neurologist". Patient stated he "feels his stomach issue which is deep down, is related to his neuropathy". He is not taking any OTC for discomfort. Discussed that the pain in his feet may be causing other discomfort and issues in his body. He agreed to FU appointment, was informed he is on wait list for any cancellations. He verbalized understanding, appreciation.

## 2016-03-29 ENCOUNTER — Ambulatory Visit (INDEPENDENT_AMBULATORY_CARE_PROVIDER_SITE_OTHER): Payer: 59 | Admitting: Podiatry

## 2016-03-29 ENCOUNTER — Ambulatory Visit (INDEPENDENT_AMBULATORY_CARE_PROVIDER_SITE_OTHER): Payer: 59

## 2016-03-29 ENCOUNTER — Encounter: Payer: Self-pay | Admitting: Podiatry

## 2016-03-29 VITALS — BP 107/58 | HR 64 | Resp 16 | Ht 77.0 in | Wt 207.0 lb

## 2016-03-29 DIAGNOSIS — M79672 Pain in left foot: Secondary | ICD-10-CM | POA: Diagnosis not present

## 2016-03-29 DIAGNOSIS — M79671 Pain in right foot: Secondary | ICD-10-CM

## 2016-03-29 DIAGNOSIS — G629 Polyneuropathy, unspecified: Secondary | ICD-10-CM | POA: Diagnosis not present

## 2016-03-29 DIAGNOSIS — M779 Enthesopathy, unspecified: Secondary | ICD-10-CM | POA: Diagnosis not present

## 2016-03-29 NOTE — Progress Notes (Signed)
Subjective:     Patient ID: Mark Morton, male   DOB: [birth year], 40 y.o.   MRN: [id number]  HPI patient states he gets pain in both feet underneath and he has been diagnosed with neuropathy and   Review of Systems  All other systems reviewed and are negative.      Objective:   Physical Exam  Constitutional: He is oriented to person, place, and time.  Cardiovascular: Intact distal pulses.   Musculoskeletal: Normal range of motion.  Neurological: He is oriented to person, place, and time.  Skin: Skin is warm and dry.  Nursing note and vitals reviewed. Neurovascular status found to be intact with patient noted to have diminishment of vibratory and sharp [place]. Patient has been diagnosed with small fiber disease and is quite a bit of discomfort in the metatarsal phalangeal joints and currently take 600 mg of Lyrica daily he is found have good digital perfusion and is well oriented 3     Assessment:     Inflammatory condition with probable neuropathy as a precipitating factor    Plan:     H&P and x-rays reviewed with patient. Today I went ahead and I've recommended orthotics for the long-term and explained cushioning type orthotics to reduce stress on his feet and patient will be seen back for [place] to recheck again as needed. Also discussed vitamin E complexes for him  X-rays indicate digital deformities with no indications of advanced arthritis

## 2016-03-29 NOTE — Progress Notes (Signed)
   Subjective:    Patient ID: Mark Morton, male    DOB: August 04, 1976, 40 y.o.   MRN: [id number]  HPI Chief Complaint  Patient presents with  . Foot Pain    Bilateral; dorsal & plantar; x1 yr  . Foot Orthotics    Wants to discuss      Review of Systems  Constitutional: Positive for fatigue.  Respiratory: Positive for chest tightness.   Allergic/Immunologic: Positive for environmental allergies.  All other systems reviewed and are negative.      Objective:   Physical Exam        Assessment & Plan:

## 2016-04-18 ENCOUNTER — Telehealth: Payer: Self-pay | Admitting: Diagnostic Neuroimaging

## 2016-04-18 ENCOUNTER — Ambulatory Visit: Payer: Self-pay | Admitting: Diagnostic Neuroimaging

## 2016-04-18 ENCOUNTER — Telehealth: Payer: Self-pay | Admitting: *Deleted

## 2016-04-18 NOTE — Telephone Encounter (Signed)
Re:     Taken 30-MAY-17 at 10:17AM by BBL ------------------------------------------------------------ Mark Morton                CID [id number]  Patient SAME                 [facility]    Area Code 336 Phone# 29 [place] 5/30 APPT @ 130, PLS CB TO RESCHEDULE         LVM requesting he call back and reschedule FU with phone staff. Advised he may speak to me if he has questions. Left name, number.

## 2016-04-18 NOTE — Telephone Encounter (Signed)
Message For: OFC                  Taken 30-MAY-17 at 10:17AM by BBL ------------------------------------------------------------  Mark Morton                 CID  [id number]   Patient  SAME                  Pt's Dr  [facility]     Area Code  336  Phone#  [address] [place] 5/30 APPT @ 130, PLS CB TO RESCHEDULE                                                               Disp:Y/N  N  If Y = C/B If No Response In 78minutes    Appt was cancelled and message left for pt to call back to r/s

## 2016-04-25 ENCOUNTER — Ambulatory Visit: Payer: 59 | Admitting: Diagnostic Neuroimaging

## 2016-05-09 ENCOUNTER — Ambulatory Visit (INDEPENDENT_AMBULATORY_CARE_PROVIDER_SITE_OTHER): Payer: 59 | Admitting: Diagnostic Neuroimaging

## 2016-05-09 ENCOUNTER — Encounter: Payer: Self-pay | Admitting: Diagnostic Neuroimaging

## 2016-05-09 VITALS — BP 105/65 | HR 67 | Ht 77.0 in | Wt 215.2 lb

## 2016-05-09 DIAGNOSIS — G609 Hereditary and idiopathic neuropathy, unspecified: Secondary | ICD-10-CM | POA: Diagnosis not present

## 2016-05-09 DIAGNOSIS — G629 Polyneuropathy, unspecified: Secondary | ICD-10-CM | POA: Diagnosis not present

## 2016-05-09 MED ORDER — PREGABALIN 200 MG PO CAPS: 200.0000 mg | ORAL_CAPSULE | Freq: Three times a day (TID) | ORAL | Status: DC

## 2016-05-09 MED ORDER — AMITRIPTYLINE HCL 25 MG PO TABS: 25.0000 mg | ORAL_TABLET | Freq: Every day | ORAL | Status: DC

## 2016-05-09 NOTE — Progress Notes (Signed)
[facility]  PATIENT: Mark Morton DOB: 31-Aug-1976  REFERRING CLINICIAN: Joya Gaskins HISTORY FROM: patient  REASON FOR VISIT: follow up    HISTORICAL  CHIEF COMPLAINT:  Chief Complaint  Patient presents with  . Neuropathy    rm 7, "went to [facility] in [facility], had NCS , small fiber study"  . Follow-up    3 month    HISTORY OF PRESENT ILLNESS:   UPDATE 05/09/16: Since last visit, had 4th opinion at [facility], no clear diagnosis. Patient had repeat EMG nerve conduction study which demonstrated mild evidence of peripheral polyneuropathy. Also found to have mild right carpal tunnel syndrome. Autonomic and small fiber neuropathy testing including QSART response shows mild impairment of adrenergic and cardio vagal responses however postganglionic pseudomotor responses were normal. Additional lab testing including sedimentation rate, protein electrophoresis, hemoglobin A1c, paraneoplastic panel, autoimmune antibody testing, were all negative. Patient was diagnosed with idiopathic small and large fiber neuropathy and advised to follow-up with local neurology for symptom control. Still with burning pain in feet and low back. Tolerating Lyrica 200 mg 3 times a day.  UPDATE 01/25/16: Since last visit patient continues to have symptoms. Had second opinion with [facility]. Records reviewed and summarized: suspected small fiber; EMG/NCS was repeated at [facility] but results not viewable in system. Patient says that very mild abnl found, but not enough to explain degree of pain.  UPDATE 11/09/15: Since last visit, having more pain/burning in feet. 2 nights ago, had sudden increase in pain and sxs. Also working with allergist re: food allergies / sensitivities. Also with 2 ER visits for poss allergic reactions. Also with hand abnl sensations (cold).   PRIOR HPI (09/28/15): 40 year old male here for evaluation of numbness and tingling feet. For past 2 years patient has had mild tingling in bilateral toes  and feet. Over past 2 months this has suddenly increased burning sensation. Also he had noticed some weakness in his hands. Patient went outside neurologist who checked EMG nerve conduction study which apparently showed neuropathy. He had some blood testing done which apparently were unremarkable. He was treated for carpal tunnel syndrome with hand splints and treated with epidural steroid injection for suspected lumbar radiculopathy. Patient requests a second opinion with me for further evaluation. Patient has been on Lyrica 100 mg twice a day with mild relief of painful symptoms. 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 He has remote history of lumbar radiculopathy radiating to the right leg status post discectomy in 1996.    REVIEW OF SYSTEMS: Full 14 system review of systems performed and negative except: shortness of breath trouble swallowing fatigue excessive thirst.   ALLERGIES: Allergies  Allergen Reactions  . Carrot [Daucus Carota] Shortness Of Breath    Can eat cooked carrots   . Almond (Diagnostic)     Swelling of throat  . Other     Green peppers, swelling of throat, unsure of this allergy    HOME MEDICATIONS: Outpatient Prescriptions Prior to Visit  Medication Sig Dispense Refill  . ALPHA LIPOIC ACID PO Take 1 tablet by mouth as needed.     . diphenhydrAMINE (BENADRYL) 25 MG tablet Take 2 tablets (50 mg total) by mouth every 6 (six) hours as needed for itching or allergies. 20 tablet 0  . EPINEPHrine 0.3 mg/0.3 mL IJ SOAJ injection Inject 0.3 mLs (0.3 mg total) into the muscle once. 1 Device 0  . famotidine (  PEPCID) 20 MG tablet Take 1 tablet (20 mg total) by mouth daily. (Patient taking differently: Take 20 mg by mouth as needed. ) 30 tablet 0  . pregabalin (LYRICA) 200 MG capsule Take 1 capsule (200 mg total) by mouth 3 (three) times daily. 90 capsule 5  . sildenafil (VIAGRA) 100 MG tablet  Take 100 mg by mouth daily as needed for erectile dysfunction.    Marland Kitchen UNABLE TO FIND Take 1 tablet by mouth as needed. Super GLA Botase oil    . DULoxetine (CYMBALTA) 30 MG capsule Take 1 capsule (30 mg total) by mouth daily. (Patient not taking: Reported on 05/09/2016) 30 capsule 6   No facility-administered medications prior to visit.    PAST MEDICAL HISTORY: Past Medical History  Diagnosis Date  . Allergy   . Anxiety   . Depression   . Chronic kidney disease     Kidney stone    PAST SURGICAL HISTORY: Past Surgical History  Procedure Laterality Date  . Lumbar disc surgery    . Cystoscopy with retrograde pyelogram, ureteroscopy and stent placement Left 06/19/2015    Procedure: CYSTOSCOPY WITH RETROGRADE PYELOGRAM, LEFT URETEROSCOPY, STONE BASKETRY AND LEFT JJ STENT PLACEMENT;  Surgeon: Carolan Clines, MD;  Location: [facility];  Service: Urology;  Laterality: Left;  . Back surgery  1996    FAMILY HISTORY: Family History  Problem Relation Age of Onset  . Cancer Maternal Grandmother   . Diabetes Maternal Grandfather   . Hypertension Maternal Grandfather   . Neuropathy Father     SOCIAL HISTORY:  Social History   Social History  . Marital Status: Married    Spouse Name: Stanton Kidney  . Number of Children: 0  . Years of Education: 16   Occupational History  .      carpentry   Social History Main Topics  . Smoking status: Never Smoker   . Smokeless tobacco: Never Used  . Alcohol Use: No     Comment: occ  . Drug Use: No  . Sexual Activity: Not on file   Other Topics Concern  . Not on file   Social History Narrative   Lives at home with wife   No caffeine      PHYSICAL EXAM  GENERAL EXAM/CONSTITUTIONAL: Vitals:  Filed Vitals:   05/09/16 1425  BP: 105/65  Pulse: 67  Height: 6\' 5"  (1.956 m)  Weight: 215 lb 3.2 oz (97.614 kg)   Body mass index is 25.51 kg/(m^2). No exam data present  Patient is in no distress; well developed, nourished and groomed; neck is  supple   MUSCULOSKELETAL:  Gait, strength, tone, movements noted in Neurologic exam below  NEUROLOGIC: MENTAL STATUS:  No flowsheet data found.  awake, alert, oriented to person, place and time  recent and remote memory intact  normal attention and concentration  language fluent, comprehension intact, naming intact,   fund of knowledge appropriate    MOTOR:   normal bulk and tone, full strength in the BUE, BLE  SENSORY:   normal and symmetric to light touch  GAIT/STATION:   narrow based gait     DIAGNOSTIC DATA (LABS, IMAGING, TESTING) - I reviewed patient records, labs, notes, testing and imaging myself where available.  Lab Results  Component Value Date   WBC 4.4 03/17/2016   HGB 14.0 03/17/2016   HCT 40.5 03/17/2016   MCV 90.8 03/17/2016   PLT 200 03/17/2016      Component Value Date/Time   NA 139 03/17/2016 2316  NA 139 09/28/2015 0951   K 3.6 03/17/2016 2316   CL 103 03/17/2016 2316   CO2 27 03/17/2016 2316   GLUCOSE 89 03/17/2016 2316   GLUCOSE 80 09/28/2015 0951   BUN 17 03/17/2016 2316   BUN 20 09/28/2015 0951   CREATININE 1.18 03/17/2016 2316   CREATININE 1.15 10/23/2012 2106   CALCIUM 9.7 03/17/2016 2316   PROT 7.1 09/28/2015 0951   PROT 6.9 10/23/2012 2106   ALBUMIN 4.7 09/28/2015 0951   ALBUMIN 4.6 10/23/2012 2106   AST 18 09/28/2015 0951   ALT 19 09/28/2015 0951   ALKPHOS 61 09/28/2015 0951   BILITOT 0.4 09/28/2015 0951   BILITOT 0.5 10/23/2012 2106   GFRNONAA >60 03/17/2016 2316   GFRAA >60 03/17/2016 2316   Lab Results  Component Value Date   CHOL 219* 10/23/2012   HDL 26* 10/23/2012   LDLCALC  10/23/2012     Comment:       Not calculated due to Triglyceride >400. Suggest ordering Direct LDL (Unit Code: [phone]).   Total Cholesterol/HDL Ratio:CHD Risk                        Coronary Heart Disease Risk Table                                        Men       Women          1/2 Average Risk              3.4        3.3               Average Risk              5.0        4.4           2X Average Risk              9.6        7.1           3X Average Risk             23.4       11.0 Use the calculated Patient Ratio above and the CHD Risk table  to determine the patient's CHD Risk. ATP III Classification (LDL):       < 100        mg/dL         Optimal      100 - 129     mg/dL         Near or Above Optimal      130 - 159     mg/dL         Borderline High      160 - 189     mg/dL         High       > 190        mg/dL         Very High     TRIG 658* 10/23/2012   CHOLHDL 8.4 10/23/2012   No results found for: HGBA1C Lab Results  Component Value Date   VITAMINB12 329 09/28/2015   Lab Results  Component Value Date   TSH 0.695 09/28/2015    08/19/15 EMG/NCS (Dr. Trula Ore) - I reviewed right data and EMG nerve conduction  report. Abnormalities in right median, ulnar, sural sensory responses noted. Abnormalities in right median, ulnar and peroneal motor response is also noted. 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 Also mentioned are moderate right median neuropathy at the wrist and multilevel lumbosacral radiculopathy. No abnormal spontaneous activity noted. Decreased motor unit recruitment noted in all muscles tested of right upper and lower extremities. Paraspinal muscles were not assessed.  OUTSIDE LABS: Lab testing including serum protein electrophoresis and immunofixation, TSH, B12, folate, and vitamin D were negative.  11/10/15 MRI brain  - normal  09/29/15 MRI lumbar spine (without) demonstrating 1. At L4-5: facet hypertrophy with mild biforaminal stenosis.  2. Multi-level facet hypertrophy.  09/28/15 LABS - GM1 IgM, HIV, RPR, ACE, lyme ab negative  11/11/15 EMG/NCS (Dr. Leta Baptist) Abnormal study demonstrate: 1. Right median neuropathy at the wrist consistent with right carpal tunnel syndrome. 2. Mild evidence of chronic left L5,  S1 radiculopathies. 3. No widespread underlying large fiber neuropathy.     ASSESSMENT AND PLAN  40 y.o. year old male here with new onset lower extremity numbness tingling burning sensation since 2014. Symptoms affecting upper and lower extremities. Extensive testing unremarkable so far (including 4 neurologists evaluations; including [facility] and [facility] evaluation).  Dx: neuropathy (idiopathic small fiber)  Neuropathy (HCC)  Hereditary and idiopathic peripheral neuropathy    PLAN: I spent 25 minutes of face to face time with patient. Greater than 50% of time was spent in counseling and coordination of care with patient. In summary we discussed:  - continue lyrica 200mg  TID - add amitriptyline 25mg  at bedtime  Meds ordered this encounter  Medications  . DISCONTD: amitriptyline (ELAVIL) 25 MG tablet    Sig: Take 1 tablet (25 mg total) by mouth at bedtime.    Dispense:  30 tablet    Refill:  3  . amitriptyline (ELAVIL) 25 MG tablet    Sig: Take 1 tablet (25 mg total) by mouth at bedtime.    Dispense:  30 tablet    Refill:  12  . pregabalin (LYRICA) 200 MG capsule    Sig: Take 1 capsule (200 mg total) by mouth 3 (three) times daily.    Dispense:  90 capsule    Refill:  5   Return in about 6 months (around 11/08/2016).    Penni Bombard, MD [id number], [id number] PM Certified in Neurology, Neurophysiology and Neuroimaging  [facility] [address], [place], [place] [address] [phone]

## 2016-05-31 ENCOUNTER — Telehealth: Payer: Self-pay | Admitting: *Deleted

## 2016-05-31 NOTE — Telephone Encounter (Signed)
Records from [facility] on [address] [place] [address] desk.

## 2016-06-13 ENCOUNTER — Encounter: Payer: Self-pay | Admitting: Allergy and Immunology

## 2016-06-13 ENCOUNTER — Ambulatory Visit (INDEPENDENT_AMBULATORY_CARE_PROVIDER_SITE_OTHER): Payer: 59 | Admitting: Allergy and Immunology

## 2016-06-13 VITALS — BP 110/60 | HR 60 | Temp 98.2°F | Resp 16 | Ht 77.0 in | Wt 210.0 lb

## 2016-06-13 DIAGNOSIS — H1010 Acute atopic conjunctivitis, unspecified eye: Secondary | ICD-10-CM

## 2016-06-13 DIAGNOSIS — J309 Allergic rhinitis, unspecified: Secondary | ICD-10-CM | POA: Diagnosis not present

## 2016-06-13 DIAGNOSIS — R0600 Dyspnea, unspecified: Secondary | ICD-10-CM | POA: Diagnosis not present

## 2016-06-13 DIAGNOSIS — T7800XD Anaphylactic reaction due to unspecified food, subsequent encounter: Secondary | ICD-10-CM | POA: Diagnosis not present

## 2016-06-13 DIAGNOSIS — T781XXD Other adverse food reactions, not elsewhere classified, subsequent encounter: Secondary | ICD-10-CM

## 2016-06-13 NOTE — Progress Notes (Signed)
Follow-up Note  Referring Provider: Vernie Shanks, MD Primary Provider: Anthoney Harada, MD Date of Office Visit: 06/13/2016  Subjective:   Mark Morton (DOB: 04-16-1976) is a 40 y.o. male who returns to the [facility] on 06/13/2016 in re-evaluation of the following:  HPI: Mark Morton returns to this clinic in reevaluation of his food allergy and oral allergy syndrome and allergic rhinoconjunctivitis. I've not seen him in his clinic since January 2017.  He has not had any allergic reactions and he's had very little problems with his allergic rhinoconjunctivitis while intermittently using an over-the-counter antihistamine and a Rhinocort nasal inhaler. He has not had to use an EpiPen.  Mark Morton has had a problem over the course of the past month feeling as though his breathing is different. He feels as though he might have a little bit of shortness of breath or he has "fuzziness" in his breathing and he has to take deep breaths on a regular basis. In addition, he's had some right posterior pleuritic pain during this timeframe. He does not have any leg swelling. However, it should be noted that he had removal of varicose veins around the same time that this issue develop. It sounds as though he did have a catheter procedure performed on his deep veins. He is not sure whether his breathing problem started first or his varicose vein procedure occurred first.  His neuropathy has really gotten out of hand and he has had multiple evaluations for this issue including going to the [facility] including going to [facility] in [place] in evaluation of this issue. He is seen multiple neurologists and there is not a good etiologic factor that's giving rise to his neuropathy.    Medication List      amitriptyline 25 MG tablet Commonly known as:  ELAVIL Take 1 tablet (25 mg total) by mouth at bedtime.   diphenhydrAMINE 25 MG tablet Commonly known as:  BENADRYL Take 2  tablets (50 mg total) by mouth every 6 (six) hours as needed for itching or allergies.   EPINEPHrine 0.3 mg/0.3 mL Soaj injection Commonly known as:  EPI-PEN Inject 0.3 mLs (0.3 mg total) into the muscle once.   HYDROcodone-acetaminophen 5-325 MG tablet Commonly known as:  NORCO/VICODIN   pregabalin 200 MG capsule Commonly known as:  LYRICA Take 1 capsule (200 mg total) by mouth 3 (three) times daily.   sildenafil 100 MG tablet Commonly known as:  VIAGRA Take 100 mg by mouth daily as needed for erectile dysfunction.       Past Medical History:  Diagnosis Date  . Allergy   . Anxiety   . Chronic kidney disease    Kidney stone  . Depression     Past Surgical History:  Procedure Laterality Date  . BACK SURGERY  1996  . CYSTOSCOPY WITH RETROGRADE PYELOGRAM, URETEROSCOPY AND STENT PLACEMENT Left 06/19/2015   Procedure: CYSTOSCOPY WITH RETROGRADE PYELOGRAM, LEFT URETEROSCOPY, STONE BASKETRY AND LEFT JJ STENT PLACEMENT;  Surgeon: Mark Clines, MD;  Location: [facility];  Service: Urology;  Laterality: Left;  . LUMBAR DISC SURGERY      Allergies  Allergen Reactions  . Carrot [Daucus Carota] Shortness Of Breath    Can eat cooked carrots   . Almond (Diagnostic)     Swelling of throat  . Other     Green peppers, swelling of throat, unsure of this allergy    Review of systems negative except as noted in HPI / PMHx or noted  below:  Review of Systems  Constitutional: Negative.   HENT: Negative.   Eyes: Negative.   Respiratory: Negative.   Cardiovascular: Negative.   Gastrointestinal: Negative.   Genitourinary: Negative.   Musculoskeletal: Negative.   Skin: Negative.   Neurological: Negative.   Endo/Heme/Allergies: Negative.   Psychiatric/Behavioral: Negative.      Objective:   Vitals:   06/13/16 1600  BP: 110/60  Pulse: 60  Resp: 16  Temp: 98.2 F (36.8 C)   Height: 6\' 5"  (195.6 cm)  Weight: 210 lb (95.3 kg)   Physical Exam  Constitutional: He is  well-developed, well-nourished, and in no distress.  HENT:  Head: Normocephalic.  Right Ear: Tympanic membrane, external ear and ear canal normal.  Left Ear: Tympanic membrane, external ear and ear canal normal.  Nose: Nose normal. No mucosal edema or rhinorrhea.  Mouth/Throat: Uvula is midline, oropharynx is clear and moist and mucous membranes are normal. No oropharyngeal exudate.  Eyes: Conjunctivae are normal.  Neck: Trachea normal. No tracheal tenderness present. No tracheal deviation present. No thyromegaly present.  Cardiovascular: Normal rate, regular rhythm, S1 normal, S2 normal and normal heart sounds.   No murmur heard. Pulmonary/Chest: Breath sounds normal. No stridor. No respiratory distress. He has no wheezes. He has no rales.  Musculoskeletal: He exhibits no edema.  Lymphadenopathy:       Head (right side): No tonsillar adenopathy present.       Head (left side): No tonsillar adenopathy present.    He has no cervical adenopathy.  Neurological: He is alert. Gait normal.  Skin: No rash noted. He is not diaphoretic. No erythema. Nails show no clubbing.  Psychiatric: Mood and affect normal.    Diagnostics:    Spirometry was performed and demonstrated an FEV1 of 4.69 at 89 % of predicted.  The patient had an Asthma Control Test with the following results:  .    Assessment and Plan:   1. Allergy with anaphylaxis due to food, subsequent encounter   2. Oral allergy syndrome, subsequent encounter   3. Allergic rhinoconjunctivitis   4. Dyspnea     1. Obtain a chest x-ray  2. If allergic reaction: Epi-Pen, benadryl, MD / ER  3. OTC Rhinocort - one spray each nostril one time per day.  4. Can use OTC antihistamine (Claritin / Zyrtec)  5. Further evaluation? Chest CT angiography?  6. Obtain fall flu vaccine  Mark Morton's allergic disease is not particularly big issue at this point in time and he can continue to use medical therapy specified above. What is somewhat of an  issue is the fact that he has a breathing pattern change and has right pleuritic chest pain. We'll start off evaluation with a chest x-ray if there is nothing significant on the chest x-ray I think he may require a CT scan with angiography to determine if he unfortunately developed a pulmonary embolus that may have resulted as a result of his vein stripping that occurred around the same time that he developed this breathing issue. We'll get that all sorted out soon as I can see the results of his chest x-ray.  Allena Katz, MD [place]

## 2016-06-13 NOTE — Patient Instructions (Addendum)
  1. Obtain a chest x-ray  2. If allergic reaction: Epi-Pen, benadryl, MD / ER  3. OTC Rhinocort - one spray each nostril one time per day.  4. Can use OTC antihistamine (Claritin / Zyrtec)  5. Further evaluation? Chest CT angiography?  6. Obtain fall flu vaccine

## 2016-06-14 ENCOUNTER — Ambulatory Visit
Admission: RE | Admit: 2016-06-14 | Discharge: 2016-06-14 | Disposition: A | Discharge status: Home / Self Care | Payer: 59 | Source: Ambulatory Visit | Attending: Allergy and Immunology | Admitting: Allergy and Immunology

## 2016-06-14 ENCOUNTER — Other Ambulatory Visit: Payer: Self-pay | Admitting: Allergy and Immunology

## 2016-06-14 DIAGNOSIS — R0602 Shortness of breath: Secondary | ICD-10-CM

## 2016-06-15 ENCOUNTER — Telehealth: Payer: Self-pay | Admitting: *Deleted

## 2016-06-15 ENCOUNTER — Other Ambulatory Visit: Payer: Self-pay | Admitting: Allergy and Immunology

## 2016-06-15 ENCOUNTER — Telehealth: Payer: Self-pay | Admitting: Diagnostic Neuroimaging

## 2016-06-15 ENCOUNTER — Ambulatory Visit
Admission: RE | Admit: 2016-06-15 | Discharge: 2016-06-15 | Disposition: A | Discharge status: Home / Self Care | Payer: 59 | Source: Ambulatory Visit | Attending: Allergy and Immunology | Admitting: Allergy and Immunology

## 2016-06-15 DIAGNOSIS — R0602 Shortness of breath: Secondary | ICD-10-CM

## 2016-06-15 DIAGNOSIS — R091 Pleurisy: Secondary | ICD-10-CM

## 2016-06-15 MED ORDER — IOPAMIDOL (ISOVUE-370) INJECTION 76%
75.0000 mL | Freq: Once | INTRAVENOUS | Status: AC | PRN
  Administered 2016-06-15: 75 mL via INTRAVENOUS

## 2016-06-15 NOTE — Telephone Encounter (Signed)
Patient is calling. For the last 2-3 weeks he has had tightening in his throat. He thinks this may be coming from taking pregabalin (LYRICA) 200 MG capsule. Please call and discuss.

## 2016-06-15 NOTE — Telephone Encounter (Signed)
Called to inform patient of his normal CT results. Results will be scanned in.

## 2016-06-15 NOTE — Telephone Encounter (Signed)
Spoke with patient who stated he increased Lyrica to 200 mg three x day for 1 1/2 months, but began to feel tightening of his throat and his "lungs felt swollen". One to 1 1/2 weeks ago he decreased it to 200 mg twice a day but stated he continued to feel slightly worse each day. He saw his allergist two days ago, had O2 level checked in his blood, which was normal. He had chest x ray and CT of chest, both normal.  He stated when he cut Lyrica back to 200 mg daily, a while back his neuropathy was worse, so he hesitated to cut back to that dose again. He stated he still has throat tightening sensation and sensation of "swollen lungs".  Informed him will discuss with Dr Leta Baptist and call him back no later than tomorrow. He verbalized understanding, appreciation.

## 2016-06-15 NOTE — Progress Notes (Signed)
PA DONE AND ORDER FAXED TO [facility]

## 2016-06-15 NOTE — Telephone Encounter (Signed)
Ok to continue lyrica 200mg  twice a day. I do not think his symptoms are related to lyrica. -VRP

## 2016-06-16 NOTE — Telephone Encounter (Signed)
Per Dr Leta Baptist, LVM advising patient Dr Leta Baptist does not think his symptoms are related to Lyrica. He advises to continue with Lyrica 200 mg twice a day. Advised if he feels he needs to be seen sooner than his 6 month FU, the phone staff can schedule for him. Left name, number.

## 2016-06-19 ENCOUNTER — Encounter: Payer: Self-pay | Admitting: *Deleted

## 2016-08-02 ENCOUNTER — Telehealth: Payer: Self-pay | Admitting: Diagnostic Neuroimaging

## 2016-08-02 NOTE — Telephone Encounter (Signed)
Have patient come in next 2 weeks to discuss medication change instead of in Nov. -VRP

## 2016-08-02 NOTE — Telephone Encounter (Signed)
Pt called in wanting to change from Lyrica to a different medication , possibly gabapentin. Please call and advise [phone]   . Pt said he did have a reaction to cymbalta- tightened his throat and hurt.

## 2016-08-03 NOTE — Telephone Encounter (Signed)
Per Dr Leta Baptist, spoke with patient and informed him that Dr Leta Baptist wants to see him within the next two weeks to discuss a medication change. Rescheduled Nov FU to 08/14/16; requested he arrive 15 min early to check in. Patient verbalized understanding, appreciation.

## 2016-08-08 ENCOUNTER — Encounter (INDEPENDENT_AMBULATORY_CARE_PROVIDER_SITE_OTHER): Payer: Self-pay

## 2016-08-08 ENCOUNTER — Encounter: Payer: Self-pay | Admitting: Allergy and Immunology

## 2016-08-08 ENCOUNTER — Ambulatory Visit (INDEPENDENT_AMBULATORY_CARE_PROVIDER_SITE_OTHER): Payer: 59 | Admitting: Allergy and Immunology

## 2016-08-08 VITALS — BP 96/58 | HR 64 | Resp 20

## 2016-08-08 DIAGNOSIS — J309 Allergic rhinitis, unspecified: Secondary | ICD-10-CM

## 2016-08-08 DIAGNOSIS — H1010 Acute atopic conjunctivitis, unspecified eye: Secondary | ICD-10-CM | POA: Diagnosis not present

## 2016-08-08 DIAGNOSIS — T781XXD Other adverse food reactions, not elsewhere classified, subsequent encounter: Secondary | ICD-10-CM | POA: Diagnosis not present

## 2016-08-08 DIAGNOSIS — T7800XD Anaphylactic reaction due to unspecified food, subsequent encounter: Secondary | ICD-10-CM | POA: Diagnosis not present

## 2016-08-08 MED ORDER — MONTELUKAST SODIUM 10 MG PO TABS: 10.0000 mg | ORAL_TABLET | Freq: Every day | ORAL | 5 refills | Status: DC

## 2016-08-08 NOTE — Patient Instructions (Signed)
  1. If allergic reaction: Epi-Pen, benadryl, MD / ER  2. Every day use the following preventative medications:   A. loratadine 10 mg 2 tablets one time per day  B. ranitidine 150 mg 2 tablets one time per day  C. montelukast 10 mg one tablet one time per day  3. Blood - alpha gal panel, C4  4. Return to clinic in 12 weeks or earlier if problem  5. Obtain fall flu vaccine

## 2016-08-08 NOTE — Progress Notes (Signed)
Follow-up Note  Referring Provider: Vernie Shanks, MD Primary Provider: Anthoney Harada, MD Date of Office Visit: 08/08/2016  Subjective:   Mark Morton (DOB: [birth year]) is a 40 y.o. male who returns to the [facility] on 08/08/2016 in re-evaluation of the following:  HPI: Mark Morton presents to this clinic in evaluation of a reaction that occurred approximately 2 weeks ago. At about 4 AM in the morning he develop a sensation of a swollen throat and tongue thickness and used an EpiPen and took Benadryl and had EMS contacted and he was better within approximately 30 minutes or so. He also had another similar reaction with some throat irritation and some slight tongue swelling that actually lasted 2 days last week without any associated systemic or constitutional symptoms that he treated with Benadryl. There is no obvious provoking factor giving rise to these issues. He has not had any new medications or new foods or new environmental exposures.  He has not been having any problems with his breathing at this point in time. We did evaluate his breathing issue back in July with a CT scan to rule out pulmonary embolus which was negative.  His allergic rhinitis is doing okay at this point in time and is no longer using Rhinocort.    Medication List      diphenhydrAMINE 25 MG tablet Commonly known as:  BENADRYL Take 2 tablets (50 mg total) by mouth every 6 (six) hours as needed for itching or allergies.   EPINEPHrine 0.3 mg/0.3 mL Soaj injection Commonly known as:  EPI-PEN Inject 0.3 mLs (0.3 mg total) into the muscle once.   pregabalin 200 MG capsule Commonly known as:  LYRICA Take 1 capsule (200 mg total) by mouth 3 (three) times daily.   sildenafil 100 MG tablet Commonly known as:  VIAGRA Take 100 mg by mouth daily as needed for erectile dysfunction.       Past Medical History:  Diagnosis Date  . Allergy   . Anxiety   . Chronic kidney disease    Kidney  stone  . Depression     Past Surgical History:  Procedure Laterality Date  . BACK SURGERY  1996  . CYSTOSCOPY WITH RETROGRADE PYELOGRAM, URETEROSCOPY AND STENT PLACEMENT Left 06/19/2015   Procedure: CYSTOSCOPY WITH RETROGRADE PYELOGRAM, LEFT URETEROSCOPY, STONE BASKETRY AND LEFT JJ STENT PLACEMENT;  Surgeon: Carolan Clines, MD;  Location: [facility];  Service: Urology;  Laterality: Left;  . LUMBAR DISC SURGERY      Allergies  Allergen Reactions  . Almond (Diagnostic) Swelling    Swelling of throat  . Carrot [Daucus Carota] Shortness Of Breath    Can eat cooked carrots   . Other Swelling    Green peppers, swelling of throat, unsure of this allergy    Review of systems negative except as noted in HPI / PMHx or noted below:  Review of Systems  Constitutional: Negative.   HENT: Negative.   Eyes: Negative.   Respiratory: Negative.   Cardiovascular: Negative.   Gastrointestinal: Negative.   Genitourinary: Negative.   Musculoskeletal: Negative.   Skin: Negative.   Neurological: Negative.   Endo/Heme/Allergies: Negative.   Psychiatric/Behavioral: Negative.      Objective:   Vitals:   08/08/16 0831  BP: (!) 96/58  Pulse: 64  Resp: 20          Physical Exam  Constitutional: He is well-developed, well-nourished, and in no distress.  HENT:  Head: Normocephalic.  Right Ear: Tympanic membrane, external  ear and ear canal normal.  Left Ear: Tympanic membrane, external ear and ear canal normal.  Nose: Nose normal. No mucosal edema or rhinorrhea.  Mouth/Throat: Uvula is midline, oropharynx is clear and moist and mucous membranes are normal. No oropharyngeal exudate.  Eyes: Conjunctivae are normal.  Neck: Trachea normal. No tracheal tenderness present. No tracheal deviation present. No thyromegaly present.  Cardiovascular: Normal rate, regular rhythm, S1 normal, S2 normal and normal heart sounds.   No murmur heard. Pulmonary/Chest: Breath sounds normal. No stridor. No  respiratory distress. He has no wheezes. He has no rales.  Musculoskeletal: He exhibits no edema.  Lymphadenopathy:       Head (right side): No tonsillar adenopathy present.       Head (left side): No tonsillar adenopathy present.    He has no cervical adenopathy.  Neurological: He is alert. Gait normal.  Skin: No rash noted. He is not diaphoretic. No erythema. Nails show no clubbing.  Psychiatric: Mood and affect normal.    Diagnostics: Results of a chest CT scan angiography did not identify any evidence of pulmonary embolus or other significant lung abnormalities.    Assessment and Plan:   1. Allergic rhinoconjunctivitis   2. Oral allergy syndrome, subsequent encounter   3. Allergy with anaphylaxis due to food, subsequent encounter     1. If allergic reaction: Epi-Pen, benadryl, MD / ER  2. Every day use the following preventative medications:   A. loratadine 10 mg 2 tablets one time per day  B. ranitidine 150 mg 2 tablets one time per day  C. montelukast 10 mg one tablet one time per day  3. Blood - alpha gal panel, C4  4. Return to clinic in 12 weeks or earlier if problem  5. Obtain fall flu vaccine  I'm not entirely sure what is going on with Mark Morton and his apparent allergic reactions involving his laryngeal structure. He very well could be having some type of oral and laryngeal edema but as well there may be a component of LPR contributing to some of these symptoms. As a preventative form of therapy I'll have him use an H1 and H2 receptor blocker as well as a leukotriene modifier and to be complete I will have him check a alpha gal panel, and because he may be having angioedema, a screening C4  level.   Allena Katz, MD [place]

## 2016-08-14 ENCOUNTER — Ambulatory Visit (INDEPENDENT_AMBULATORY_CARE_PROVIDER_SITE_OTHER): Payer: 59 | Admitting: Diagnostic Neuroimaging

## 2016-08-14 ENCOUNTER — Encounter: Payer: Self-pay | Admitting: Diagnostic Neuroimaging

## 2016-08-14 VITALS — BP 100/64 | HR 66 | Wt 199.2 lb

## 2016-08-14 DIAGNOSIS — R292 Abnormal reflex: Secondary | ICD-10-CM | POA: Diagnosis not present

## 2016-08-14 DIAGNOSIS — G609 Hereditary and idiopathic neuropathy, unspecified: Secondary | ICD-10-CM

## 2016-08-14 DIAGNOSIS — G629 Polyneuropathy, unspecified: Secondary | ICD-10-CM | POA: Diagnosis not present

## 2016-08-14 DIAGNOSIS — Z91018 Allergy to other foods: Secondary | ICD-10-CM

## 2016-08-14 MED ORDER — PREGABALIN 200 MG PO CAPS: 200.0000 mg | ORAL_CAPSULE | Freq: Two times a day (BID) | ORAL | 5 refills | Status: DC

## 2016-08-14 MED ORDER — GABAPENTIN 300 MG PO CAPS: 300.0000 mg | ORAL_CAPSULE | Freq: Three times a day (TID) | ORAL | 11 refills | Status: DC

## 2016-08-14 NOTE — Progress Notes (Signed)
[facility]  PATIENT: Mark Morton DOB: 01/16/1976  REFERRING CLINICIAN: Joya Gaskins HISTORY FROM: patient  REASON FOR VISIT: follow up    HISTORICAL  CHIEF COMPLAINT:  Chief Complaint  Patient presents with  . Other    rm 6, Neuropathy, "cont to have burning in my feet-consider gabapentin insread of Lyrica; tightness in my chest"  . Follow-up    [facility] to discuss med change    HISTORY OF PRESENT ILLNESS:   UPDATE 08/14/16: Since last visit, having more idiopathic allergy reactions (last 2 weeks ago; needed epi pen and benadryl at night; no clear trigger). Saw allergist --> now on claritin, zantac and singulair. In terms of neuropathy, lyrica was helping, but now he is wondering if lyrica may be contributing to allergic reactions. Has tried elimination diets without success.   UPDATE 05/09/16: Since last visit, had 4th opinion at [facility], no clear diagnosis. Patient had repeat EMG nerve conduction study which demonstrated mild evidence of peripheral polyneuropathy. Also found to have mild right carpal tunnel syndrome. Autonomic and small fiber neuropathy testing including QSART response shows mild impairment of adrenergic and cardio vagal responses however postganglionic pseudomotor responses were normal. Additional lab testing including sedimentation rate, protein electrophoresis, hemoglobin A1c, paraneoplastic panel, autoimmune antibody testing, were all negative. Patient was diagnosed with idiopathic small and large fiber neuropathy and advised to follow-up with local neurology for symptom control. Still with burning pain in feet and low back. Tolerating Lyrica 200 mg 3 times a day.  UPDATE 01/25/16: Since last visit patient continues to have symptoms. Had second opinion with [facility]. Records reviewed and summarized: suspected small fiber; EMG/NCS was repeated at [facility] but results not viewable in system. Patient says that very mild abnl found, but not enough to explain degree of  pain.  UPDATE 11/09/15: Since last visit, having more pain/burning in feet. 2 nights ago, had sudden increase in pain and sxs. Also working with allergist re: food allergies / sensitivities. Also with 2 ER visits for poss allergic reactions. Also with hand abnl sensations (cold).   PRIOR HPI (09/28/15): 40 year old male here for evaluation of numbness and tingling feet. For past 2 years patient has had mild tingling in bilateral toes and feet. Over past 2 months this has suddenly increased burning sensation. Also he had noticed some weakness in his hands. Patient went outside neurologist who checked EMG nerve conduction study which apparently showed neuropathy. He had some blood testing done which apparently were unremarkable. He was treated for carpal tunnel syndrome with hand splints and treated with epidural steroid injection for suspected lumbar radiculopathy. Patient requests a second opinion with me for further evaluation. Patient has been on Lyrica 100 mg twice a day with mild relief of painful symptoms. 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 He has remote history of lumbar radiculopathy radiating to the right leg status post discectomy in 1996.   REVIEW OF SYSTEMS: Full 14 system review of systems performed and negative except: shortness of breath trouble swallowing fatigue excessive thirst.   ALLERGIES: Allergies  Allergen Reactions  . Almond (Diagnostic) Swelling    Swelling of throat  . Carrot [Daucus Carota] Shortness Of Breath    Can eat cooked carrots   . Other Swelling    Green peppers, swelling of throat, unsure of this allergy    HOME MEDICATIONS: Outpatient Medications Prior to Visit  Medication Sig Dispense Refill  .  diphenhydrAMINE (BENADRYL) 25 MG tablet Take 2 tablets (50 mg total) by mouth every 6 (six) hours as needed for itching or allergies. 20 tablet 0  . EPINEPHrine 0.3 mg/0.3 mL  IJ SOAJ injection Inject 0.3 mLs (0.3 mg total) into the muscle once. 1 Device 0  . montelukast (SINGULAIR) 10 MG tablet Take 1 tablet (10 mg total) by mouth at bedtime. 30 tablet 5  . pregabalin (LYRICA) 200 MG capsule Take 1 capsule (200 mg total) by mouth 3 (three) times daily. 90 capsule 5  . sildenafil (VIAGRA) 100 MG tablet Take 100 mg by mouth daily as needed for erectile dysfunction.     No facility-administered medications prior to visit.     PAST MEDICAL HISTORY: Past Medical History:  Diagnosis Date  . Allergy   . Anxiety   . Chronic kidney disease    Kidney stone  . Depression   . Neuropathy ([place])     PAST SURGICAL HISTORY: Past Surgical History:  Procedure Laterality Date  . BACK SURGERY  1996  . CYSTOSCOPY WITH RETROGRADE PYELOGRAM, URETEROSCOPY AND STENT PLACEMENT Left 06/19/2015   Procedure: CYSTOSCOPY WITH RETROGRADE PYELOGRAM, LEFT URETEROSCOPY, STONE BASKETRY AND LEFT JJ STENT PLACEMENT;  Surgeon: Carolan Clines, MD;  Location: [facility];  Service: Urology;  Laterality: Left;  . LUMBAR DISC SURGERY      FAMILY HISTORY: Family History  Problem Relation Age of Onset  . Neuropathy Father   . Cancer Maternal Grandmother   . Diabetes Maternal Grandfather   . Hypertension Maternal Grandfather     SOCIAL HISTORY:  Social History   Social History  . Marital status: Married    Spouse name: Mary  . Number of children: 0  . Years of education: 69   Occupational History  .      carpentry   Social History Main Topics  . Smoking status: Never Smoker  . Smokeless tobacco: Never Used  . Alcohol use No     Comment: occ  . Drug use: No  . Sexual activity: Not on file   Other Topics Concern  . Not on file   Social History Narrative   Lives at home with wife   No caffeine      PHYSICAL EXAM  GENERAL EXAM/CONSTITUTIONAL: Vitals:  Vitals:   08/14/16 0903  BP: 100/64  Pulse: 66  Weight: 199 lb 3.2 oz (90.4 kg)     Patient is in no distress;  well developed, nourished and groomed; neck is supple  CARDIOVASCULAR:  Examination of carotid arteries is normal; no carotid bruits  Regular rate and rhythm, no murmurs  Examination of peripheral vascular system by observation and palpation is normal  EYES:  Ophthalmoscopic exam of optic discs and posterior segments is normal; no papilledema or hemorrhages  MUSCULOSKELETAL:  Gait, strength, tone, movements noted in Neurologic exam below  NEUROLOGIC: MENTAL STATUS:   awake, alert, oriented to person, place and time  recent and remote memory intact  normal attention and concentration  language fluent, comprehension intact, naming intact,   fund of knowledge appropriate  CRANIAL NERVE:   2nd - no papilledema on fundoscopic exam  2nd, 3rd, 4th, 6th - pupils equal and reactive to light, visual fields full to confrontation, extraocular muscles intact, no nystagmus  5th - facial sensation symmetric  7th - facial strength symmetric  8th - hearing intact  9th - palate elevates symmetrically, uvula midline  11th - shoulder shrug symmetric  12th - tongue protrusion midline  MOTOR:  normal bulk and tone, full strength in the BUE, BLE  SENSORY:   normal and symmetric to light touch, pinprick, temperature, vibration and proprioception  COORDINATION:   finger-nose-finger, fine finger movements, heel-shin normal  REFLEXES:   deep tendon reflexes RUE TRACE, LUE 1, BLE 0  GAIT/STATION:   narrow based gait      DIAGNOSTIC DATA (LABS, IMAGING, TESTING) - I reviewed patient records, labs, notes, testing and imaging myself where available.  Lab Results  Component Value Date   WBC 4.4 03/17/2016   HGB 14.0 03/17/2016   HCT 40.5 03/17/2016   MCV 90.8 03/17/2016   PLT 200 03/17/2016      Component Value Date/Time   NA 139 03/17/2016 2316   NA 139 09/28/2015 0951   K 3.6 03/17/2016 2316   CL 103 03/17/2016 2316   CO2 27 03/17/2016 2316   GLUCOSE 89  03/17/2016 2316   BUN 17 03/17/2016 2316   BUN 20 09/28/2015 0951   CREATININE 1.18 03/17/2016 2316   CREATININE 1.15 10/23/2012 2106   CALCIUM 9.7 03/17/2016 2316   PROT 7.1 09/28/2015 0951   ALBUMIN 4.7 09/28/2015 0951   AST 18 09/28/2015 0951   ALT 19 09/28/2015 0951   ALKPHOS 61 09/28/2015 0951   BILITOT 0.4 09/28/2015 0951   GFRNONAA >60 03/17/2016 2316   GFRAA >60 03/17/2016 2316   Lab Results  Component Value Date   CHOL 219 (H) 10/23/2012   HDL 26 (L) 10/23/2012   [place]  10/23/2012     Comment:       Not calculated due to Triglyceride >400. Suggest ordering Direct LDL (Unit Code: [phone]).   Total Cholesterol/HDL Ratio:CHD Risk                        Coronary Heart Disease Risk Table                                        Men       Women          1/2 Average Risk              3.4        3.3              Average Risk              5.0        4.4           2X Average Risk              9.6        7.1           3X Average Risk             23.4       11.0 Use the calculated Patient Ratio above and the CHD Risk table  to determine the patient's CHD Risk. ATP III Classification (LDL):       < 100        mg/dL         Optimal      100 - 129     mg/dL         Near or Above Optimal      130 - 159     mg/dL         Borderline High  160 - 189     mg/dL         High       > 190        mg/dL         Very High     TRIG 658 (H) 10/23/2012   CHOLHDL 8.4 10/23/2012   No results found for: HGBA1C Lab Results  Component Value Date   VITAMINB12 329 09/28/2015   Lab Results  Component Value Date   TSH 0.695 09/28/2015    08/19/15 EMG/NCS (Dr. Trula Ore) - I reviewed right data and EMG nerve conduction report. Abnormalities in right median, ulnar, sural sensory responses noted. Abnormalities in right median, ulnar and peroneal motor response is also noted. 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 Also  mentioned are moderate right median neuropathy at the wrist and multilevel lumbosacral radiculopathy. No abnormal spontaneous activity noted. Decreased motor unit recruitment noted in all muscles tested of right upper and lower extremities. Paraspinal muscles were not assessed.  OUTSIDE LABS: Lab testing including serum protein electrophoresis and immunofixation, TSH, B12, folate, and vitamin D were negative.  11/10/15 MRI brain  - normal  09/29/15 MRI lumbar spine (without) demonstrating 1. At L4-5: facet hypertrophy with mild biforaminal stenosis.  2. Multi-level facet hypertrophy.  09/28/15 LABS - GM1 IgM, HIV, RPR, ACE, lyme ab negative  11/11/15 EMG/NCS (Dr. Leta Baptist) Abnormal study demonstrate: 1. Right median neuropathy at the wrist consistent with right carpal tunnel syndrome. 2. Mild evidence of chronic left L5, S1 radiculopathies. 3. No widespread underlying large fiber neuropathy.     ASSESSMENT AND PLAN  40 y.o. year old male here with new onset lower extremity numbness tingling burning sensation since 2014. Symptoms affecting upper and lower extremities. Extensive testing unremarkable so far (including 4 neurologists evaluations; including [facility] and  [facility] evaluation).   Dx: neuropathy (idiopathic small fiber)  Neuropathy ([facility])  Hereditary and idiopathic peripheral neuropathy  Areflexia  Food allergy    PLAN: I spent 40 minutes of face to face time with patient. Greater than 50% of time was spent in counseling and coordination of care with patient. In summary we discussed:   - continue lyrica 200mg  twice a day--> in 1 month, reduce lyrica to 200mg  daily and add gabapentin 300mg  twice a day; after 1 more week, stop lyrica and then increase gabapentin to 300mg  three times per day; after 1-2 more weeks may increase up to 600mg  three times per day - continue allergy medication treatments - discussed environmental and food allergen possibilities - in future may  consider repeat EMG/NCS, LP and nerve biopsy (2018)   Meds ordered this encounter  Medications  . pregabalin (LYRICA) 200 MG capsule    Sig: Take 1 capsule (200 mg total) by mouth 2 (two) times daily.    Dispense:  90 capsule    Refill:  5  . gabapentin (NEURONTIN) 300 MG capsule    Sig: Take 1 capsule (300 mg total) by mouth 3 (three) times daily.    Dispense:  90 capsule    Refill:  11   Return in about 3 months (around 11/13/2016).   Penni Bombard, MD [id number], [id number] AM Certified in Neurology, Neurophysiology and Neuroimaging  [facility] [address], [place], [place] [address] [phone]

## 2016-08-14 NOTE — Patient Instructions (Signed)
-   continue lyrica 200mg  twice a day--> in 1 month, reduce lyrica to 200mg  daily and add gabapentin 300mg  twice a day; after 1 more week, stop lyrica and then increase gabapentin to 300mg  three times per day; after 1-2 more weeks may increase up to 600mg  three times per day  - continue allergy medication treatments

## 2016-08-15 LAB — C4 COMPLEMENT: COMPLEMENT C4, SERUM: 16 mg/dL (ref 14–44)

## 2016-08-15 LAB — ALPHA-GAL PANEL
Alpha Gal IgE*: <0.1 kU/L (ref <0.35)
BEEF CLASS INTERPRETATION: 0
Beef (Bos spp) IgE: <0.1 kU/L (ref <0.35)
Class Interpretation: 0
Class Interpretation: 0
Pork (Sus spp) IgE: <0.1 kU/L (ref <0.35)

## 2016-08-26 IMAGING — CT CT RENAL STONE PROTOCOL
2 of 4 series · 16 of 46 positions shown, 18 images · non-contrast
Comparison: None.

CLINICAL DATA: Left-sided flank pain. Left anterior abdominal pain.

EXAM:
CT ABDOMEN AND PELVIS WITHOUT CONTRAST
TECHNIQUE: Multidetector CT imaging of the abdomen and pelvis was performed
following the standard protocol without IV contrast.

[Series 2: renal stone > 200 lbs 5.0 b31f · axial · 0.74mm/px · z∈[+513,+978]mm · 13 of 103 slices shown, 15 images]
[im 5/103  soft-tissue]
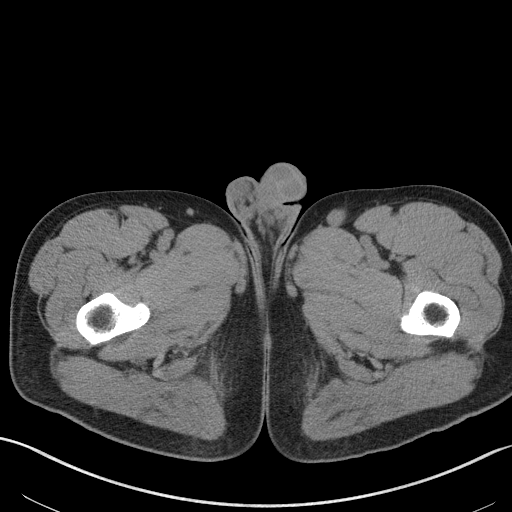
[im 5/103  bone]
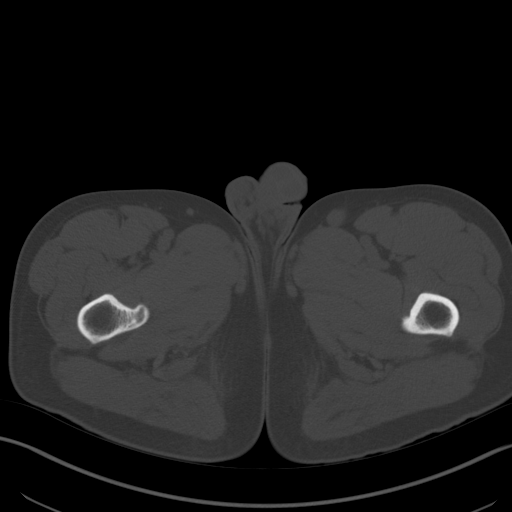
[im 14/103  soft-tissue]
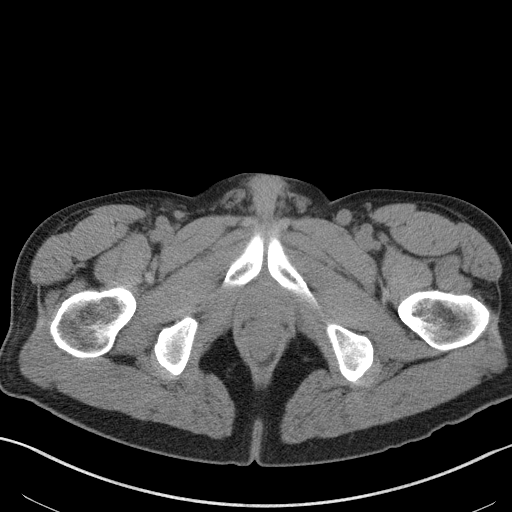
[im 23/103  soft-tissue]
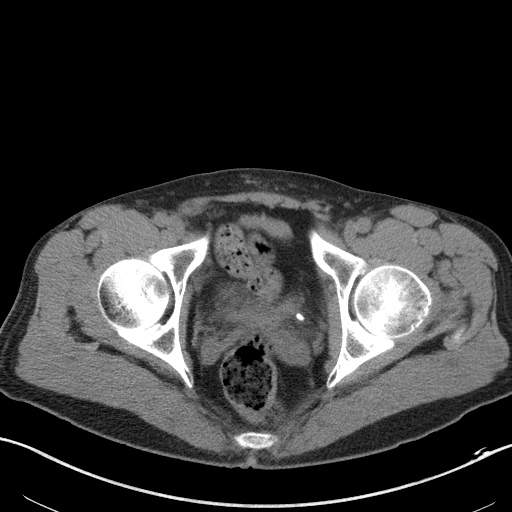
[im 27/103  soft-tissue]
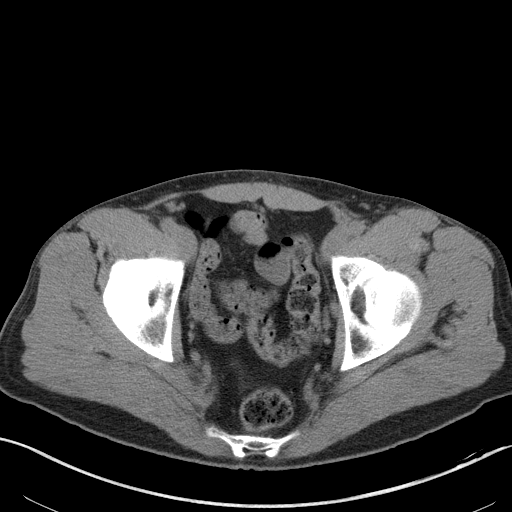
[im 36/103  soft-tissue]
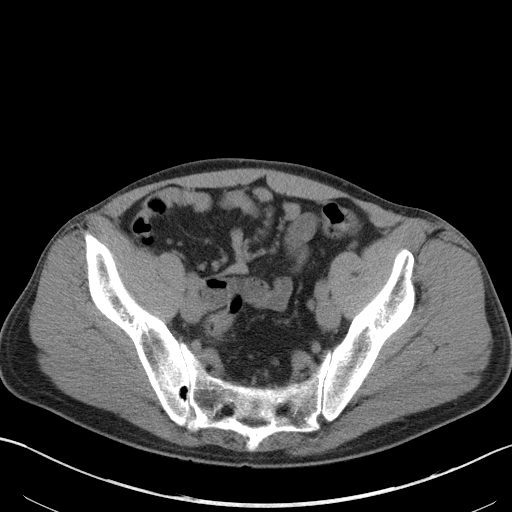
[im 45/103  soft-tissue]
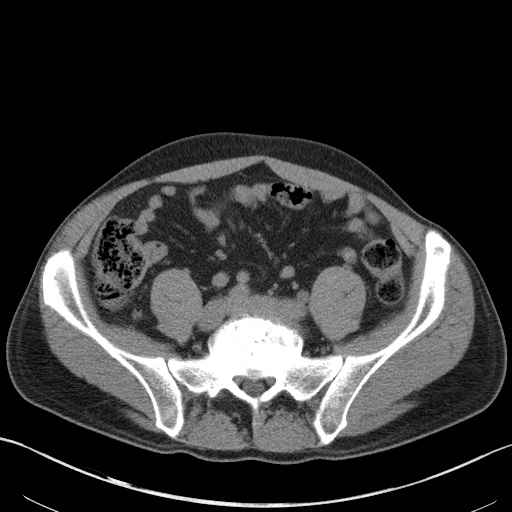
[im 54/103  soft-tissue]
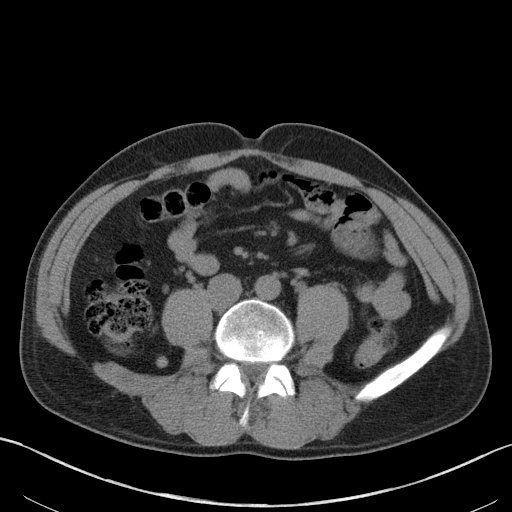
[im 58/103  soft-tissue]
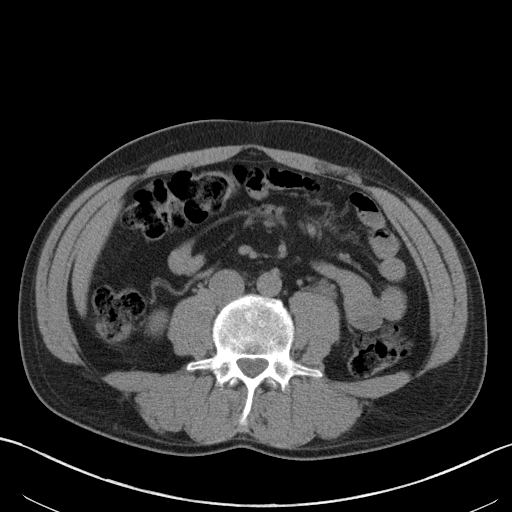
[im 67/103  soft-tissue]
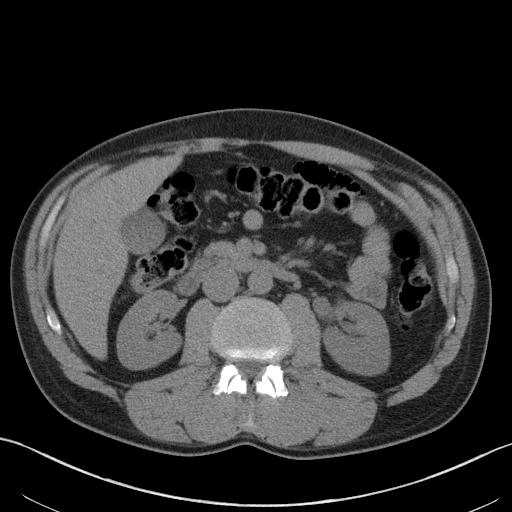
[im 67/103  bone]
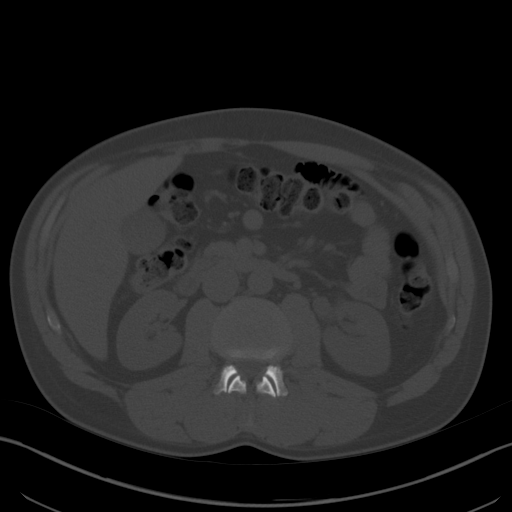
[im 76/103  soft-tissue]
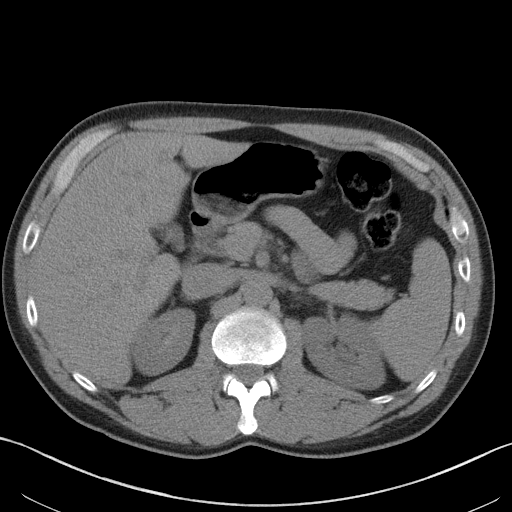
[im 80/103  soft-tissue]
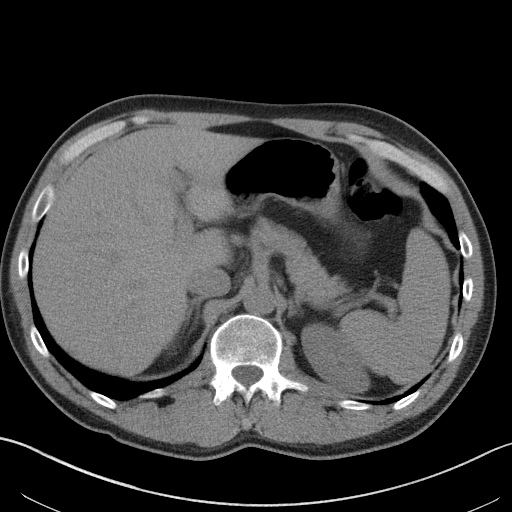
[im 89/103  soft-tissue]
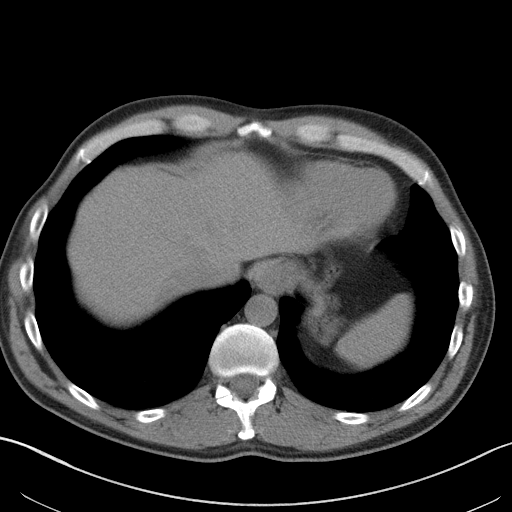
[im 98/103  soft-tissue]
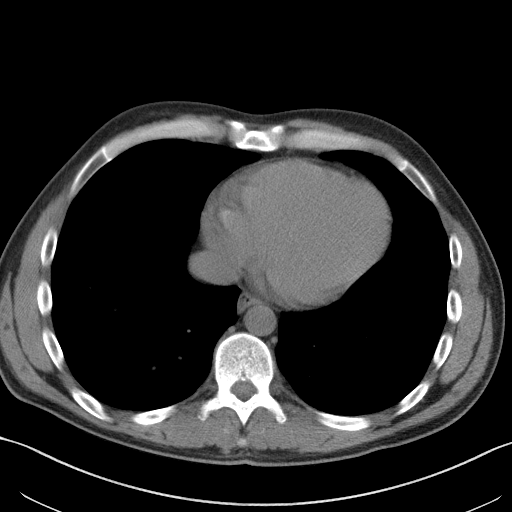

[Series 5: renal stone 3.0 coronal · coronal · 0.78mm/px · 3 of 92 slices shown]
[im 31/92  soft-tissue]
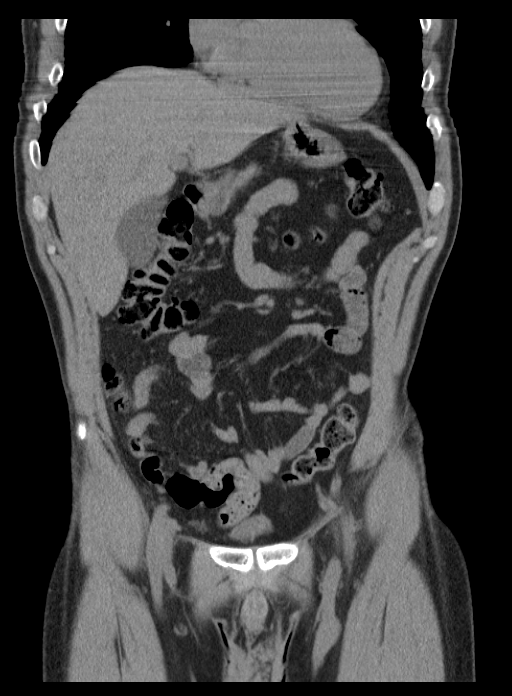
[im 41/92  soft-tissue]
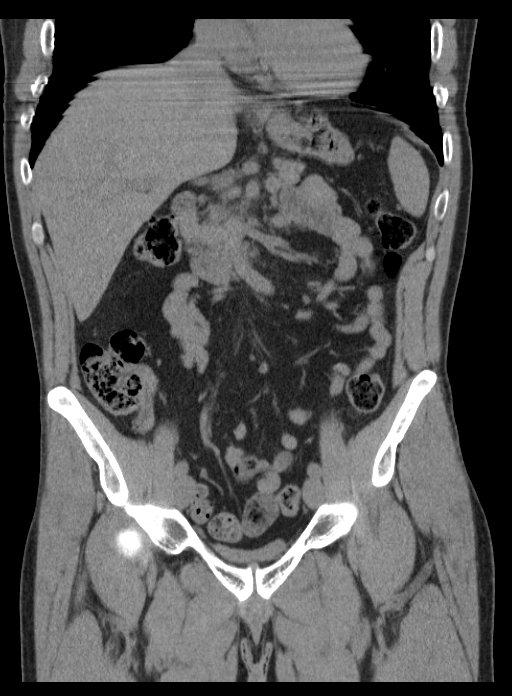
[im 51/92  soft-tissue]
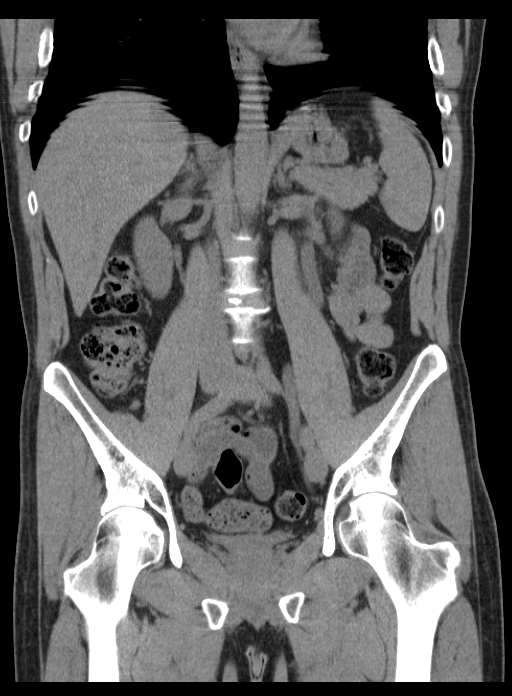

[16 of 46 positions shown; findings below may reference images not displayed]

FINDINGS: The lung bases demonstrate mild dependent atelectasis bilaterally.
The heart size is normal. No significant pleural or pericardial
effusion is present.

The liver and spleen are within normal limits. The stomach,
duodenum, and pancreas are unremarkable. The common bile duct and
gallbladder are within normal limits. The adrenal glands are normal
bilaterally. Ill-defined density along the medullary pyramids of the
right kidney without focal stones.

A nonobstructing 6 mm stone is present in the left kidney. The left
ureter is dilated. An obstructing 6 mm stone is present just above
the left UVJ. Urinary bladder is decompressed.

The rectosigmoid colon is within normal limits. The more proximal
colon is within normal limits. The appendix is visualized and
normal. Small bowel is unremarkable. No significant adenopathy or
free fluid is present.

Bone windows demonstrate chronic loss of disc height at L5-S1 with
mild endplate change. No focal lytic or blastic lesions are present.
IMPRESSION: 1. Obstructing 6 mm distal left ureteral stone just above the UVJ.
This results in moderate dilation of the left ureter and mild left
hydronephrosis.
2. An additional nonobstructing 6 mm stone is present in the
midportion of the left kidney.
3. No significant stones on the right.
4. Focal degenerative disc disease at L5-S1.

## 2016-08-29 ENCOUNTER — Other Ambulatory Visit: Payer: Self-pay | Admitting: Diagnostic Neuroimaging

## 2016-09-06 ENCOUNTER — Telehealth: Payer: Self-pay | Admitting: Diagnostic Neuroimaging

## 2016-09-06 NOTE — Telephone Encounter (Signed)
Pt called said he heard of pt's getting relief from neuropathic pain by getting in a bariatric chamber. What is your thoughts Dr Mamie Nick?

## 2016-09-07 NOTE — Telephone Encounter (Signed)
I do not have experience with hyperbaric oxygen therapy (HBOT) for peripheral neuropathy. There is insufficient clinical research information to support or refute this therapy.   According to one article: "HBOT has been shown to reduce pain using animal models. Early clinical research indicates HBOT may also be useful in modulating human pain; however, further studies are required to determine whether HBOT is a safe and efficacious treatment modality for chronic pain conditions." - Pain Pract. 2016 Jun;16(5):620-8. doi: 10.1111/papr.12312. Epub 2015 May 19.   Penni Bombard, MD [id number], [id number] AM Certified in Neurology, Neurophysiology and Neuroimaging  [facility] [address], [place], [place] [address] [phone]

## 2016-09-07 NOTE — Telephone Encounter (Signed)
Spoke with patient and relayed to him Dr Gladstone Lighter reply verbatim. Patient verbalized appreciation for Dr Leta Baptist looking into the subject for him. He verbalized understanding.

## 2016-09-12 ENCOUNTER — Other Ambulatory Visit: Payer: Self-pay | Admitting: *Deleted

## 2016-09-12 MED ORDER — EPINEPHRINE 0.3 MG/0.3ML IJ SOAJ: 0.3000 mg | Freq: Once | INTRAMUSCULAR | 2 refills | Status: AC

## 2016-09-13 ENCOUNTER — Encounter: Payer: Self-pay | Admitting: Allergy and Immunology

## 2016-09-13 ENCOUNTER — Encounter (INDEPENDENT_AMBULATORY_CARE_PROVIDER_SITE_OTHER): Payer: Self-pay

## 2016-09-13 ENCOUNTER — Ambulatory Visit (INDEPENDENT_AMBULATORY_CARE_PROVIDER_SITE_OTHER): Payer: 59 | Admitting: Allergy and Immunology

## 2016-09-13 VITALS — BP 100/60 | HR 90 | Resp 16 | Ht 77.0 in | Wt 202.0 lb

## 2016-09-13 DIAGNOSIS — R0600 Dyspnea, unspecified: Secondary | ICD-10-CM | POA: Diagnosis not present

## 2016-09-13 DIAGNOSIS — J3089 Other allergic rhinitis: Secondary | ICD-10-CM

## 2016-09-13 DIAGNOSIS — T7800XD Anaphylactic reaction due to unspecified food, subsequent encounter: Secondary | ICD-10-CM

## 2016-09-13 DIAGNOSIS — T781XXD Other adverse food reactions, not elsewhere classified, subsequent encounter: Secondary | ICD-10-CM | POA: Diagnosis not present

## 2016-09-13 NOTE — Progress Notes (Signed)
Follow-up Note  Referring Provider: Vernie Shanks, MD Primary Provider: Anthoney Harada, MD Date of Office Visit: 09/13/2016  Subjective:   Mark Morton (DOB: 1976/06/27) is a 40 y.o. male who returns to the [facility] on 09/13/2016 in re-evaluation of the following:  HPI: Mark Morton presents this clinic in evaluation of a reaction that occurred over the course of the past 2 days. The night before last he ate some chocolate chip cookies very late at night and he woke up at around 4 AM in the morning to feed his baby with Enfamil based formula and he developed acute shortness of breath while feeding the baby and he became somewhat scared about this situation and took Benadryl and his reaction improved. He had a little bit more problem with shortness of breath throughout the rest of that day and he ended up taking Benadryl at 8 AM and 12:30 PM and 6:30 PM and 10:30 PM and this did appear to help the situation and in fact now he has no issue at all. However, he gets very dry and somewhat sedated with Benadryl. He believes that there is a problem with dairy consumption as over the course of the past 2 months whenever he consumed dairy he would develop a very itchy back. He's been predominantly dairy free for the past 2 months except for the consumption of these chocolate chip cookies.    Medication List      diphenhydrAMINE 25 MG tablet Commonly known as:  BENADRYL Take 2 tablets (50 mg total) by mouth every 6 (six) hours as needed for itching or allergies.   gabapentin 300 MG capsule Commonly known as:  NEURONTIN Take 1 capsule (300 mg total) by mouth 3 (three) times daily.   loratadine 10 MG tablet Commonly known as:  CLARITIN Take 10 mg by mouth daily.   montelukast 10 MG tablet Commonly known as:  SINGULAIR Take 1 tablet (10 mg total) by mouth at bedtime.   pregabalin 200 MG capsule Commonly known as:  LYRICA Take 1 capsule (200 mg total) by mouth 2 (two)  times daily.   ranitidine 150 MG tablet Commonly known as:  ZANTAC Take 150 mg by mouth 2 (two) times daily. 08/14/16 OTC Zantac, unsure of exact dose   sildenafil 100 MG tablet Commonly known as:  VIAGRA Take 100 mg by mouth daily as needed for erectile dysfunction.       Past Medical History:  Diagnosis Date  . Allergy   . Anxiety   . Chronic kidney disease    Kidney stone  . Depression   . Neuropathy [facility])     Past Surgical History:  Procedure Laterality Date  . BACK SURGERY  1996  . CYSTOSCOPY WITH RETROGRADE PYELOGRAM, URETEROSCOPY AND STENT PLACEMENT Left 06/19/2015   Procedure: CYSTOSCOPY WITH RETROGRADE PYELOGRAM, LEFT URETEROSCOPY, STONE BASKETRY AND LEFT JJ STENT PLACEMENT;  Surgeon: Carolan Clines, MD;  Location: [facility];  Service: Urology;  Laterality: Left;  . LUMBAR DISC SURGERY      Allergies  Allergen Reactions  . Almond (Diagnostic) Swelling    Swelling of throat  . Carrot [Daucus Carota] Shortness Of Breath    Can eat cooked carrots   . Other Swelling    Green peppers, swelling of throat, unsure of this allergy    Review of systems negative except as noted in HPI / PMHx or noted below:  Review of Systems  Constitutional: Negative.   HENT: Negative.   Eyes: Negative.  Respiratory: Negative.   Cardiovascular: Negative.   Gastrointestinal: Negative.   Genitourinary: Negative.   Musculoskeletal: Negative.   Skin: Negative.   Neurological: Negative.   Endo/Heme/Allergies: Negative.   Psychiatric/Behavioral: Negative.      Objective:   Vitals:   09/13/16 1129  BP: 100/60  Pulse: 90  Resp: 16   Height: 6\' 5"  (195.6 cm)  Weight: 202 lb (91.6 kg)   Physical Exam  Constitutional: He is well-developed, well-nourished, and in no distress.  HENT:  Head: Normocephalic.  Right Ear: Tympanic membrane, external ear and ear canal normal.  Left Ear: Tympanic membrane, external ear and ear canal normal.  Nose: Nose normal. No mucosal edema  or rhinorrhea.  Mouth/Throat: Uvula is midline, oropharynx is clear and moist and mucous membranes are normal. No oropharyngeal exudate.  Eyes: Conjunctivae are normal.  Neck: Trachea normal. No tracheal tenderness present. No tracheal deviation present. No thyromegaly present.  Cardiovascular: Normal rate, regular rhythm, S1 normal, S2 normal and normal heart sounds.   No murmur heard. Pulmonary/Chest: Breath sounds normal. No stridor. No respiratory distress. He has no wheezes. He has no rales.  Musculoskeletal: He exhibits no edema.  Lymphadenopathy:       Head (right side): No tonsillar adenopathy present.       Head (left side): No tonsillar adenopathy present.    He has no cervical adenopathy.  Neurological: He is alert. Gait normal.  Skin: No rash noted. He is not diaphoretic. No erythema. Nails show no clubbing.  Psychiatric: Mood and affect normal.    Diagnostics: None   Assessment and Plan:   1. Dyspnea, unspecified type   2. Oral allergy syndrome, subsequent encounter   3. Allergy with anaphylaxis due to food, subsequent encounter   4. Other allergic rhinitis     1. If allergic reaction: Epi-Pen, benadryl, MD / ER  2. Every day use the following preventative medications:   A. Cetirizine - One tablet one time per day.   3. Go completely dairy free for next month and observe what happens with this approach.  3. Return to clinic in 12 weeks or earlier if problem  5. Obtain fall flu vaccine  Amal has had some type of reaction which is poorly defined and I'm just going to have him use an antihistamine every day as all of the therapy that I administered in the past for his quasi-allergic symptoms have not really helped him to any large degree. Maybe he does have a sensitivity to dairy and I recommended that he go entirely dairy free for the next month to see what happens with this elimination diet. He'll keep in contact with me noting noting his response and if he does  well see him back in this clinic in 12 weeks or earlier if there is a problem.  Allena Katz, MD [place]

## 2016-09-13 NOTE — Patient Instructions (Signed)
  1. If allergic reaction: Epi-Pen, benadryl, MD / ER  2. Every day use the following preventative medications:   A. Cetirizine - One tablet one time per day.   3. Go completely dairy free for next month and observe what happens with this approach.  3. Return to clinic in 12 weeks or earlier if problem  5. Obtain fall flu vaccine

## 2016-09-25 ENCOUNTER — Ambulatory Visit: Payer: 59 | Admitting: Diagnostic Neuroimaging

## 2016-10-27 ENCOUNTER — Telehealth: Payer: Self-pay | Admitting: *Deleted

## 2016-10-27 NOTE — Telephone Encounter (Signed)
Patient called office at 9:00 am stating that he had been exposed to paint fumes in a house with work yesterday and he had shortness of breath yesterday and tingling feeling in his body.  No hives, no swelling of mouth, tongue or throat. Patient was not in distress on phone.  Patient was concerned about feeling he gets while he is in paint environments.  States this episode has happened 3-4 times now.  Patient has follow up with Dr. Neldon Mc in January. I discussed Emergency Action Plan with patient.  Patient took one dose of Benadryl last night and did get some relief.  Patient did not feel he needed Epi-Pen.  Patient has been taking Cetirizine everyday.   Patient was instructed to take Benadryl now according to Emergency Action Plan and to call me back at 10:30 am.  Patient is to use Epi-Pen if severe reactions start to occur and go to ED.   I called patient back at 10:30 am and he was doing better.  He did take Benadryl as directed.  Patient was not in any distress.  Patient was still concerned about the tingling sensation when he is around paint.  Patient was given an appointment on Monday to see Dr. Nelva Bush.  Patient to call office if he has any additional issues and to follow Emergency Action Plan.

## 2016-10-30 ENCOUNTER — Ambulatory Visit: Payer: Self-pay | Admitting: Allergy

## 2016-10-30 ENCOUNTER — Ambulatory Visit: Payer: 59 | Admitting: Diagnostic Neuroimaging

## 2016-11-06 ENCOUNTER — Telehealth: Payer: Self-pay | Admitting: Diagnostic Neuroimaging

## 2016-11-06 NOTE — Telephone Encounter (Signed)
LVM for patient advising that online there are the Lyrica.[email or web address], [email or web address] and [email or web address] that he can investigate for discounts he may be eligible for. Left number for further questions.

## 2016-11-06 NOTE — Telephone Encounter (Signed)
Pt's wife called wanting to know if there were discount cards for lyrica.  pls call the pt at [phone]

## 2016-11-22 ENCOUNTER — Ambulatory Visit (INDEPENDENT_AMBULATORY_CARE_PROVIDER_SITE_OTHER): Payer: BLUE CROSS/BLUE SHIELD | Admitting: Diagnostic Neuroimaging

## 2016-11-22 ENCOUNTER — Encounter: Payer: Self-pay | Admitting: Diagnostic Neuroimaging

## 2016-11-22 VITALS — BP 98/55 | HR 69 | Wt 221.0 lb

## 2016-11-22 DIAGNOSIS — G629 Polyneuropathy, unspecified: Secondary | ICD-10-CM | POA: Diagnosis not present

## 2016-11-22 DIAGNOSIS — M79672 Pain in left foot: Secondary | ICD-10-CM

## 2016-11-22 DIAGNOSIS — G609 Hereditary and idiopathic neuropathy, unspecified: Secondary | ICD-10-CM

## 2016-11-22 DIAGNOSIS — M79671 Pain in right foot: Secondary | ICD-10-CM

## 2016-11-22 DIAGNOSIS — R292 Abnormal reflex: Secondary | ICD-10-CM | POA: Diagnosis not present

## 2016-11-22 MED ORDER — PREGABALIN 200 MG PO CAPS: 200.0000 mg | ORAL_CAPSULE | Freq: Two times a day (BID) | ORAL | 5 refills | Status: DC

## 2016-11-22 NOTE — Progress Notes (Signed)
[facility]  PATIENT: Mark Morton DOB: 04-Dec-1975  REFERRING CLINICIAN: Joya Gaskins HISTORY FROM: patient  REASON FOR VISIT: follow up    HISTORICAL  CHIEF COMPLAINT:  Chief Complaint  Patient presents with  . Neuropathy    "if it weren't for Lyrica I would hurt more than just in my feet"  . Follow-up    3 month    HISTORY OF PRESENT ILLNESS:   UPDATE 11/22/16: Since last visit, doing about the same. Back on lyrica (works better than gabapentin or cymbalta). Working on allergy issues with integrative clinic ([place] [facility]; [place] Pain levels stable.   UPDATE 08/14/16: Since last visit, having more idiopathic allergy reactions (last 2 weeks ago; needed epi pen and benadryl at night; no clear trigger). Saw allergist --> now on claritin, zantac and singulair. In terms of neuropathy, lyrica was helping, but now he is wondering if lyrica may be contributing to allergic reactions. Has tried elimination diets without success.   UPDATE 05/09/16: Since last visit, had 4th opinion at [facility], no clear diagnosis. Patient had repeat EMG nerve conduction study which demonstrated mild evidence of peripheral polyneuropathy. Also found to have mild right carpal tunnel syndrome. Autonomic and small fiber neuropathy testing including QSART response shows mild impairment of adrenergic and cardio vagal responses however postganglionic pseudomotor responses were normal. Additional lab testing including sedimentation rate, protein electrophoresis, hemoglobin A1c, paraneoplastic panel, autoimmune antibody testing, were all negative. Patient was diagnosed with idiopathic small and large fiber neuropathy and advised to follow-up with local neurology for symptom control. Still with burning pain in feet and low back. Tolerating Lyrica 200 mg 3 times a day.  UPDATE 01/25/16: Since last visit patient continues to have symptoms. Had second opinion with [facility]. Records reviewed and summarized: suspected  small fiber; EMG/NCS was repeated at [facility] but results not viewable in system. Patient says that very mild abnl found, but not enough to explain degree of pain.  UPDATE 11/09/15: Since last visit, having more pain/burning in feet. 2 nights ago, had sudden increase in pain and sxs. Also working with allergist re: food allergies / sensitivities. Also with 2 ER visits for poss allergic reactions. Also with hand abnl sensations (cold).   PRIOR HPI (09/28/15): 41 year old male here for evaluation of numbness and tingling feet. For past 2 years patient has had mild tingling in bilateral toes and feet. Over past 2 months this has suddenly increased burning sensation. Also he had noticed some weakness in his hands. Patient went outside neurologist who checked EMG nerve conduction study which apparently showed neuropathy. He had some blood testing done which apparently were unremarkable. He was treated for carpal tunnel syndrome with hand splints and treated with epidural steroid injection for suspected lumbar radiculopathy. Patient requests a second opinion with me for further evaluation. Patient has been on Lyrica 100 mg twice a day with mild relief of painful symptoms. 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 He has remote history of lumbar radiculopathy radiating to the right leg status post discectomy in 1996.   REVIEW OF SYSTEMS: Full 14 system review of systems performed and negative except: as per HPI.   ALLERGIES: Allergies  Allergen Reactions  . Almond (Diagnostic) Swelling    Swelling of throat  . Carrot [Daucus Carota] Shortness Of Breath    Can eat cooked carrots   . Other Swelling    Green peppers, swelling of throat,  unsure of this allergy  . Duloxetine Other (See Comments)    Other, throat swelling, tightening of inner body  . Gabapentin Other (See Comments)    Other, throat swelling, tightness of inner body      HOME MEDICATIONS: Outpatient Medications Prior to Visit  Medication Sig Dispense Refill  . diphenhydrAMINE (BENADRYL) 25 MG tablet Take 2 tablets (50 mg total) by mouth every 6 (six) hours as needed for itching or allergies. 20 tablet 0  . pregabalin (LYRICA) 200 MG capsule Take 1 capsule (200 mg total) by mouth 2 (two) times daily. 90 capsule 5  . sildenafil (VIAGRA) 100 MG tablet Take 100 mg by mouth daily as needed for erectile dysfunction.    . gabapentin (NEURONTIN) 300 MG capsule Take 1 capsule (300 mg total) by mouth 3 (three) times daily. (Patient not taking: Reported on 09/13/2016) 90 capsule 11  . loratadine (CLARITIN) 10 MG tablet Take 10 mg by mouth daily.    . montelukast (SINGULAIR) 10 MG tablet Take 1 tablet (10 mg total) by mouth at bedtime. (Patient not taking: Reported on 09/13/2016) 30 tablet 5  . ranitidine (ZANTAC) 150 MG tablet Take 150 mg by mouth 2 (two) times daily. 08/14/16 OTC Zantac, unsure of exact dose     No facility-administered medications prior to visit.     PAST MEDICAL HISTORY: Past Medical History:  Diagnosis Date  . Allergy   . Anxiety   . Chronic kidney disease    Kidney stone  . Depression   . Neuropathy ([place])     PAST SURGICAL HISTORY: Past Surgical History:  Procedure Laterality Date  . BACK SURGERY  1996  . CYSTOSCOPY WITH RETROGRADE PYELOGRAM, URETEROSCOPY AND STENT PLACEMENT Left 06/19/2015   Procedure: CYSTOSCOPY WITH RETROGRADE PYELOGRAM, LEFT URETEROSCOPY, STONE BASKETRY AND LEFT JJ STENT PLACEMENT;  Surgeon: Carolan Clines, MD;  Location: [facility];  Service: Urology;  Laterality: Left;  . LUMBAR DISC SURGERY      FAMILY HISTORY: Family History  Problem Relation Age of Onset  . Neuropathy Father   . Cancer Maternal Grandmother   . Diabetes Maternal Grandfather   . Hypertension Maternal Grandfather     SOCIAL HISTORY:  Social History   Social History  . Marital status: Married    Spouse name: Mary  . Number of  children: 0  . Years of education: 36   Occupational History  .      carpentry   Social History Main Topics  . Smoking status: Never Smoker  . Smokeless tobacco: Never Used  . Alcohol use No     Comment: occ  . Drug use: No  . Sexual activity: Yes    Partners: Female    Birth control/ protection: None   Other Topics Concern  . Not on file   Social History Narrative   Lives at home with wife   No caffeine      PHYSICAL EXAM  GENERAL EXAM/CONSTITUTIONAL: Vitals:  Vitals:   11/22/16 0759  BP: (!) 98/55  Pulse: 69  Weight: 221 lb (100.2 kg)    Patient is in no distress; well developed, nourished and groomed; neck is supple  CARDIOVASCULAR:  Examination of carotid arteries is normal; no carotid bruits  Regular rate and rhythm, no murmurs  Examination of peripheral vascular system by observation and palpation is normal  EYES:  Ophthalmoscopic exam of optic discs and posterior segments is normal; no papilledema or hemorrhages  MUSCULOSKELETAL:  Gait, strength, tone, movements noted in Neurologic exam  below  NEUROLOGIC: MENTAL STATUS:   awake, alert, oriented to person, place and time  recent and remote memory intact  normal attention and concentration  language fluent, comprehension intact, naming intact,   fund of knowledge appropriate  CRANIAL NERVE:   2nd - no papilledema on fundoscopic exam  2nd, 3rd, 4th, 6th - pupils equal and reactive to light, visual fields full to confrontation, extraocular muscles intact, no nystagmus  5th - facial sensation symmetric  7th - facial strength symmetric  8th - hearing intact  9th - palate elevates symmetrically, uvula midline  11th - shoulder shrug symmetric  12th - tongue protrusion midline  MOTOR:   normal bulk and tone, full strength in the BUE, BLE  SENSORY:   normal and symmetric to light touch, pinprick, temperature, vibration  SLIGHT DECR PP IN BOTTOM OF FEET  DISTALLY  COORDINATION:   finger-nose-finger, fine finger movements normal  REFLEXES:   deep tendon reflexes RUE TRACE, LUE 1, RIGHT KNEE TRACE; LEFT KNEE 1; ANKLES 0  GAIT/STATION:   narrow based gait      DIAGNOSTIC DATA (LABS, IMAGING, TESTING) - I reviewed patient records, labs, notes, testing and imaging myself where available.  Lab Results  Component Value Date   WBC 4.4 03/17/2016   HGB 14.0 03/17/2016   HCT 40.5 03/17/2016   MCV 90.8 03/17/2016   PLT 200 03/17/2016      Component Value Date/Time   NA 139 03/17/2016 2316   NA 139 09/28/2015 0951   K 3.6 03/17/2016 2316   CL 103 03/17/2016 2316   CO2 27 03/17/2016 2316   GLUCOSE 89 03/17/2016 2316   BUN 17 03/17/2016 2316   BUN 20 09/28/2015 0951   CREATININE 1.18 03/17/2016 2316   CREATININE 1.15 10/23/2012 2106   CALCIUM 9.7 03/17/2016 2316   PROT 7.1 09/28/2015 0951   ALBUMIN 4.7 09/28/2015 0951   AST 18 09/28/2015 0951   ALT 19 09/28/2015 0951   ALKPHOS 61 09/28/2015 0951   BILITOT 0.4 09/28/2015 0951   GFRNONAA >60 03/17/2016 2316   GFRAA >60 03/17/2016 2316   Lab Results  Component Value Date   CHOL 219 (H) 10/23/2012   HDL 26 (L) 10/23/2012   [place]  10/23/2012     Comment:       Not calculated due to Triglyceride >400. Suggest ordering Direct LDL (Unit Code: [phone]).   Total Cholesterol/HDL Ratio:CHD Risk                        Coronary Heart Disease Risk Table                                        Men       Women          1/2 Average Risk              3.4        3.3              Average Risk              5.0        4.4           2X Average Risk              9.6        7.1  3X Average Risk             23.4       11.0 Use the calculated Patient Ratio above and the CHD Risk table  to determine the patient's CHD Risk. ATP III Classification (LDL):       < 100        mg/dL         Optimal      100 - 129     mg/dL         Near or Above Optimal      130 - 159     mg/dL          Borderline High      160 - 189     mg/dL         High       > 190        mg/dL         Very High     TRIG 658 (H) 10/23/2012   CHOLHDL 8.4 10/23/2012   No results found for: HGBA1C Lab Results  Component Value Date   VITAMINB12 329 09/28/2015   Lab Results  Component Value Date   TSH 0.695 09/28/2015    08/19/15 EMG/NCS (Dr. Trula Ore) - I reviewed right data and EMG nerve conduction report. Abnormalities in right median, ulnar, sural sensory responses noted. Abnormalities in right median, ulnar and peroneal motor response is also noted. 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 Also mentioned are moderate right median neuropathy at the wrist and multilevel lumbosacral radiculopathy. No abnormal spontaneous activity noted. Decreased motor unit recruitment noted in all muscles tested of right upper and lower extremities. Paraspinal muscles were not assessed.  OUTSIDE LABS: Lab testing including serum protein electrophoresis and immunofixation, TSH, B12, folate, and vitamin D were negative.  11/10/15 MRI brain  - normal  09/29/15 MRI lumbar spine (without) demonstrating 1. At L4-5: facet hypertrophy with mild biforaminal stenosis.  2. Multi-level facet hypertrophy.  09/28/15 LABS - GM1 IgM, HIV, RPR, ACE, lyme ab negative  11/11/15 EMG/NCS (Dr. Leta Baptist) Abnormal study demonstrate: 1. Right median neuropathy at the wrist consistent with right carpal tunnel syndrome. 2. Mild evidence of chronic left L5, S1 radiculopathies. 3. No widespread underlying large fiber neuropathy.     ASSESSMENT AND PLAN  41 y.o. year old male here with new onset lower extremity numbness tingling burning sensation since 2014. Symptoms affecting upper and lower extremities. Extensive testing unremarkable so far (including 4 neurologists evaluations; including [facility] and [facility] evaluation).  Tried and failed: gabapentin, cymbalta,  topical neuropathy cream   Dx: neuropathy (idiopathic small fiber)  Hereditary and idiopathic peripheral neuropathy  Areflexia  Neuropathy (H[facility])  Pain in both feet    PLAN: - continue lyrica 200mg  twice a day - consider amitriptyline 25mg  at bedtime - continue allergy treatments - discussed environmental and food allergen possibilities  Meds ordered this encounter  Medications  . pregabalin (LYRICA) 200 MG capsule    Sig: Take 1 capsule (200 mg total) by mouth 2 (two) times daily.    Dispense:  90 capsule    Refill:  5   Return in about 6 months (around 05/22/2017).   Penni Bombard, MD [id number], [id number] AM Certified in Neurology, Neurophysiology and Neuroimaging  [facility] [address] [place], [place] [address] [phone]

## 2016-11-28 ENCOUNTER — Ambulatory Visit: Payer: 59 | Admitting: Allergy and Immunology

## 2016-11-28 ENCOUNTER — Telehealth: Payer: Self-pay | Admitting: Allergy

## 2016-11-28 NOTE — Telephone Encounter (Signed)
ADJUSTED NO SHOW FEE THIS TIME - TOLD HER WE WOULD NOT BE ABLE TO IN THE FUTURE

## 2016-11-28 NOTE — Telephone Encounter (Signed)
Please call pt wife regarding a bill she received that she was curious about the break down of it.

## 2016-12-05 ENCOUNTER — Telehealth: Payer: Self-pay | Admitting: *Deleted

## 2016-12-05 NOTE — Telephone Encounter (Signed)
Lyrica PA started on cover my meds.

## 2016-12-05 NOTE — Telephone Encounter (Signed)
PA approved [employer]BS, dates of [place] 12/05/16 - 11/19/2038, ref # [id number] notified by fax.

## 2017-01-08 ENCOUNTER — Ambulatory Visit (INDEPENDENT_AMBULATORY_CARE_PROVIDER_SITE_OTHER): Payer: BLUE CROSS/BLUE SHIELD | Admitting: Allergy and Immunology

## 2017-01-08 ENCOUNTER — Encounter: Payer: Self-pay | Admitting: Allergy and Immunology

## 2017-01-08 VITALS — BP 98/70 | HR 78 | Resp 18

## 2017-01-08 DIAGNOSIS — R5383 Other fatigue: Secondary | ICD-10-CM

## 2017-01-08 DIAGNOSIS — J4531 Mild persistent asthma with (acute) exacerbation: Secondary | ICD-10-CM | POA: Diagnosis not present

## 2017-01-08 DIAGNOSIS — T7800XD Anaphylactic reaction due to unspecified food, subsequent encounter: Secondary | ICD-10-CM

## 2017-01-08 DIAGNOSIS — J3089 Other allergic rhinitis: Secondary | ICD-10-CM

## 2017-01-08 MED ORDER — BUDESONIDE 180 MCG/ACT IN AEPB: INHALATION_SPRAY | RESPIRATORY_TRACT | 5 refills | Status: DC

## 2017-01-08 MED ORDER — ALBUTEROL SULFATE HFA 108 (90 BASE) MCG/ACT IN AERS: INHALATION_SPRAY | RESPIRATORY_TRACT | 1 refills | Status: DC

## 2017-01-08 NOTE — Progress Notes (Signed)
Follow-up Note  Referring Provider: Vernie Shanks, MD Primary Provider: Anthoney Harada, MD Date of Office Visit: 01/08/2017  Subjective:   Mark Morton (DOB: 10/25/1976) is a 41 y.o. male who returns to the [facility] on 01/08/2017 in re-evaluation of the following:  HPI: Mark Morton returns to this clinic in reevaluation of his apparent food allergy and oral pollinosis syndrome and other issues that have arisen over the course of the past several months. I last saw him in his clinic in October 2017.  He still continues to have some issues with nasal congestion. When he takes Zyrtec this does help somewhat. He's not had any associated ugly nasal discharge or anosmia or headaches.  He has been having issues with "chest tightness" and "chest constriction" along with the sensation that he sometimes gets some tightness in his throat. This appears to occur with inside the household. He gets better when he locates outside the household. This became so severe several days ago that he developed all these respiratory tract symptoms and felt this "burning sensation" inside his body and he used an EpiPen and everything completely resolved. He had no other associated systemic or constitutional symptoms with that issue other then the fact that he gets fatigued during this episodes.  He thought that he might of had a problem with dairy consumption during his last visit but now he can eat dairy with no problem whatsoever.  He refuses to receive the flu vaccine.  Allergies as of 01/08/2017      Reactions   Almond (diagnostic) Swelling   Swelling of throat   Carrot [daucus Carota] Shortness Of Breath   Can eat cooked carrots    Other Swelling   Green peppers, swelling of throat, unsure of this allergy   Duloxetine Other (See Comments)   Other, throat swelling, tightening of inner body   Gabapentin Other (See Comments)   Other, throat swelling, tightness of inner body        Medication List      cetirizine 10 MG tablet Commonly known as:  ZYRTEC Take 10 mg by mouth daily.   diphenhydrAMINE 25 MG tablet Commonly known as:  BENADRYL Take 2 tablets (50 mg total) by mouth every 6 (six) hours as needed for itching or allergies.   pregabalin 200 MG capsule Commonly known as:  LYRICA Take 1 capsule (200 mg total) by mouth 2 (two) times daily.   sildenafil 100 MG tablet Commonly known as:  VIAGRA Take 100 mg by mouth daily as needed for erectile dysfunction.   UNABLE TO FIND Med Name: Betonite clay- supplement   UNABLE TO FIND Med Name: glutathione supplement - cleansing element       Past Medical History:  Diagnosis Date  . Allergy   . Anxiety   . Chronic kidney disease    Kidney stone  . Depression   . Neuropathy [facility])     Past Surgical History:  Procedure Laterality Date  . BACK SURGERY  1996  . CYSTOSCOPY WITH RETROGRADE PYELOGRAM, URETEROSCOPY AND STENT PLACEMENT Left 06/19/2015   Procedure: CYSTOSCOPY WITH RETROGRADE PYELOGRAM, LEFT URETEROSCOPY, STONE BASKETRY AND LEFT JJ STENT PLACEMENT;  Surgeon: Carolan Clines, MD;  Location: [facility];  Service: Urology;  Laterality: Left;  . LUMBAR DISC SURGERY      Review of systems negative except as noted in HPI / PMHx or noted below:  Review of Systems  Constitutional: Negative.   HENT: Negative.   Eyes: Negative.  Respiratory: Negative.   Cardiovascular: Negative.   Gastrointestinal: Negative.   Genitourinary: Negative.   Musculoskeletal: Negative.   Skin: Negative.   Neurological: Negative.   Endo/Heme/Allergies: Negative.   Psychiatric/Behavioral: Negative.      Objective:   Vitals:   01/08/17 0948  BP: 98/70  Pulse: 78  Resp: 18          Physical Exam  Constitutional: He is well-developed, well-nourished, and in no distress.  Nasal voice  HENT:  Head: Normocephalic.  Right Ear: Tympanic membrane, external ear and ear canal normal.  Left Ear: Tympanic  membrane, external ear and ear canal normal.  Nose: Mucosal edema present. No rhinorrhea.  Mouth/Throat: Uvula is midline, oropharynx is clear and moist and mucous membranes are normal. No oropharyngeal exudate.  Eyes: Conjunctivae are normal.  Neck: Trachea normal. No tracheal tenderness present. No tracheal deviation present. No thyromegaly present.  Cardiovascular: Normal rate, regular rhythm, S1 normal, S2 normal and normal heart sounds.   No murmur heard. Pulmonary/Chest: Breath sounds normal. No stridor. No respiratory distress. He has no wheezes. He has no rales.  Musculoskeletal: He exhibits no edema.  Lymphadenopathy:       Head (right side): No tonsillar adenopathy present.       Head (left side): No tonsillar adenopathy present.    He has no cervical adenopathy.  Neurological: He is alert. Gait normal.  Skin: No rash noted. He is not diaphoretic. No erythema. Nails show no clubbing.  Psychiatric: Mood and affect normal.    Diagnostics:    Spirometry was performed and demonstrated an FEV1 of 5.09 at 100 % of predicted.  Assessment and Plan:   1. Allergy with anaphylaxis due to food, subsequent encounter   2. Other allergic rhinitis   3. Asthma, not well controlled, mild persistent, with acute exacerbation   4. Fatigue, unspecified type     1. If allergic reaction: Epi-Pen, benadryl, MD / ER  2. Every day use the following preventative medications:   A. Cetirizine 10mg  - One tablet one time per day.   B. OTC Rhinocort - one spray each nostril one time per day  C. Pulmicort 180 - two inhalations two time per day  3. If needed:   A. Proventil HFA 2 inhalations every 4-6 hours if needed.   4. Return to clinic in 4 weeks or earlier if problem  5. Area 2 aeroallergen blood test, CBC w/diff, IgE  I will assume that Sabastien may have a component of respiratory tract inflammation not affecting just his upper away but also his lower airway and treat him with  anti-inflammatory agents as noted above and assess his response over the course of the next several weeks. In addition, I will check blood tests to define his atopic disease in a little more detail. He'll contact me should he have significant problems during the interval.  Allena Katz, MD Allergy / [place]

## 2017-01-08 NOTE — Patient Instructions (Addendum)
  1. If allergic reaction: Epi-Pen, benadryl, MD / ER  2. Every day use the following preventative medications:   A. Cetirizine 10mg  - One tablet one time per day.   B. OTC Rhinocort - one spray each nostril one time per day  C. Pulmicort 180 - two inhalations two time per day  3. If needed:   A. Proventil HFA 2 inhalations every 4-6 hours if needed.   4. Return to clinic in 4 weeks or earlier if problem  5. Area 2 aeroallergen blood test, CBC w/diff, IgE

## 2017-01-11 ENCOUNTER — Other Ambulatory Visit: Payer: Self-pay | Admitting: *Deleted

## 2017-01-11 MED ORDER — MOMETASONE FUROATE 200 MCG/ACT IN AERO: 1.0000 | INHALATION_SPRAY | Freq: Every day | RESPIRATORY_TRACT | 4 refills | Status: DC

## 2017-01-11 NOTE — Telephone Encounter (Signed)
PER DR KOZLOW REPLACEMENT SENT FOR PULMICORT TO NO:[id number] DUE TO INS NON COVERAGE.  RX ASMANEX 200 ONE PUFF DAILY

## 2017-01-12 LAB — CBC WITH DIFFERENTIAL/PLATELET
BASOS: 0 %
Basophils Absolute: 0 10*3/uL (ref 0.0–0.2)
EOS (ABSOLUTE): 0.1 10*3/uL (ref 0.0–0.4)
EOS: 2 %
HEMOGLOBIN: 15.2 g/dL (ref 13.0–17.7)
Hematocrit: 43.4 % (ref 37.5–51.0)
IMMATURE GRANS (ABS): 0 10*3/uL (ref 0.0–0.1)
IMMATURE GRANULOCYTES: 0 %
Lymphocytes Absolute: 1.3 10*3/uL (ref 0.7–3.1)
Lymphs: 38 %
MCH: 31.9 pg (ref 26.6–33.0)
MCHC: 35 g/dL (ref 31.5–35.7)
MCV: 91 fL (ref 79–97)
MONOCYTES: 9 %
Monocytes Absolute: 0.3 10*3/uL (ref 0.1–0.9)
NEUTROS ABS: 1.8 10*3/uL (ref 1.4–7.0)
NEUTROS PCT: 51 %
PLATELETS: 217 10*3/uL (ref 150–379)
RBC: 4.76 x10E6/uL (ref 4.14–5.80)
RDW: 13.5 % (ref 12.3–15.4)
WBC: 3.5 10*3/uL (ref 3.4–10.8)

## 2017-01-12 LAB — ALLERGENS W/TOTAL IGE AREA 2
Aspergillus Fumigatus IgE: <0.1 kU/L
Bermuda Grass IgE: <0.1 kU/L
Cladosporium Herbarum IgE: <0.1 kU/L
Cockroach, German IgE: <0.1 kU/L
Common Silver Birch IgE: 0.72 kU/L — AB
Cottonwood IgE: <0.1 kU/L
Dog Dander IgE: <0.1 kU/L
IgE (Immunoglobulin E), Serum: 6 IU/mL (ref 0–100)
Johnson Grass IgE: <0.1 kU/L
Ragweed, Short IgE: <0.1 kU/L
T007-IGE OAK, WHITE: 0.47 kU/L — AB
White Mulberry IgE: <0.1 kU/L

## 2017-02-06 ENCOUNTER — Ambulatory Visit: Payer: BLUE CROSS/BLUE SHIELD | Admitting: Allergy and Immunology

## 2017-02-07 ENCOUNTER — Encounter: Payer: Self-pay | Admitting: Allergy and Immunology

## 2017-02-07 ENCOUNTER — Ambulatory Visit (INDEPENDENT_AMBULATORY_CARE_PROVIDER_SITE_OTHER): Payer: BLUE CROSS/BLUE SHIELD | Admitting: Allergy and Immunology

## 2017-02-07 VITALS — BP 120/62 | HR 56 | Temp 98.0°F

## 2017-02-07 DIAGNOSIS — J4520 Mild intermittent asthma, uncomplicated: Secondary | ICD-10-CM | POA: Diagnosis not present

## 2017-02-07 DIAGNOSIS — T781XXD Other adverse food reactions, not elsewhere classified, subsequent encounter: Secondary | ICD-10-CM | POA: Diagnosis not present

## 2017-02-07 DIAGNOSIS — J3089 Other allergic rhinitis: Secondary | ICD-10-CM

## 2017-02-07 NOTE — Progress Notes (Signed)
Follow-up Note  Referring Provider: Vernie Shanks, MD Primary Provider: Anthoney Harada, MD Date of Office Visit: 02/07/2017  Subjective:   Mark Morton (DOB: 06-26-1976) is a 41 y.o. male who returns to the [facility] [place] on 02/07/2017 in re-evaluation of the following:  HPI: Mark Morton returns to this clinic in reevaluation of his history of oral pollinosis and food allergy and a recent onset issue with chest tightness and chest constriction addressed during his last visit of 01/08/2017. At that point time we started him on anti-inflammatory agents for his respiratory tract including a nasal steroid and an inhaled steroid.  He stopped his inhaled steroid and he stopped his nasal steroid because he did not really think that these helped any of his respiratory tract symptoms and he is relying on 50 mg of Benadryl when he comes home from work at dinner and then 50 mg before bedtime. While using this plan he has had very good control of all his respiratory tract symptoms involving both his upper and lower airway and he does not need to use a short acting bronchodilator.   Allergies as of 02/07/2017      Reactions   Almond (diagnostic) Swelling   Swelling of throat   Carrot [daucus Carota] Shortness Of Breath   Can eat cooked carrots    Other Swelling   Green peppers, swelling of throat, unsure of this allergy   Duloxetine Other (See Comments)   Other, throat swelling, tightening of inner body   Gabapentin Other (See Comments)   Other, throat swelling, tightness of inner body      Medication List      albuterol 108 (90 Base) MCG/ACT inhaler Commonly known as:  PROVENTIL HFA;VENTOLIN HFA Inhale two puffs every four to six hours as needed for cough or wheeze.   ASHWAGANDHA PO Take 450 mcg by mouth 2 (two) times daily.   cetirizine 10 MG tablet Commonly known as:  ZYRTEC Take 10 mg by mouth daily.   diphenhydrAMINE 25 MG tablet Commonly known as:   BENADRYL Take 2 tablets (50 mg total) by mouth every 6 (six) hours as needed for itching or allergies.   GLUTATHIONE PO Take by mouth 2 (two) times daily. Liposomal Glutathione.   METHYL B-12 PO Take 5,000 mcg by mouth.   Mometasone Furoate 200 MCG/ACT Aero Commonly known as:  ASMANEX HFA Inhale 1 puff into the lungs daily.   NUTRITIONAL SUPPLEMENT PO Take by mouth. Pregnenolone 100 mg.   pregabalin 200 MG capsule Commonly known as:  LYRICA Take 1 capsule (200 mg total) by mouth 2 (two) times daily.   sildenafil 100 MG tablet Commonly known as:  VIAGRA Take 100 mg by mouth daily as needed for erectile dysfunction.   Vitamin D3 5000 units Caps Take 5,000 Units by mouth.   VITAMIN K2 PO Take 150 mcg by mouth. Vitamin K2 mk-7       Past Medical History:  Diagnosis Date  . Allergy   . Anxiety   . Chronic kidney disease    Kidney stone  . Depression   . Neuropathy [facility])     Past Surgical History:  Procedure Laterality Date  . BACK SURGERY  1996  . CYSTOSCOPY WITH RETROGRADE PYELOGRAM, URETEROSCOPY AND STENT PLACEMENT Left 06/19/2015   Procedure: CYSTOSCOPY WITH RETROGRADE PYELOGRAM, LEFT URETEROSCOPY, STONE BASKETRY AND LEFT JJ STENT PLACEMENT;  Surgeon: Carolan Clines, MD;  Location: [facility];  Service: Urology;  Laterality: Left;  . LUMBAR DISC SURGERY  Review of systems negative except as noted in HPI / PMHx or noted below:  Review of Systems  Constitutional: Negative.   HENT: Negative.   Eyes: Negative.   Respiratory: Negative.   Cardiovascular: Negative.   Gastrointestinal: Negative.   Genitourinary: Negative.   Musculoskeletal: Negative.   Skin: Negative.   Neurological: Negative.   Endo/Heme/Allergies: Negative.   Psychiatric/Behavioral: Negative.      Objective:   Vitals:   02/07/17 1108  BP: 120/62  Pulse: (!) 56  Temp: 98 F (36.7 C)          Physical Exam  Constitutional: He is well-developed, well-nourished, and in no  distress.  HENT:  Head: Normocephalic.  Right Ear: Tympanic membrane, external ear and ear canal normal.  Left Ear: Tympanic membrane, external ear and ear canal normal.  Nose: Nose normal. No mucosal edema or rhinorrhea.  Mouth/Throat: Uvula is midline, oropharynx is clear and moist and mucous membranes are normal. No oropharyngeal exudate.  Eyes: Conjunctivae are normal.  Neck: Trachea normal. No tracheal tenderness present. No tracheal deviation present. No thyromegaly present.  Cardiovascular: Normal rate, regular rhythm, S1 normal, S2 normal and normal heart sounds.   No murmur heard. Pulmonary/Chest: Breath sounds normal. No stridor. No respiratory distress. He has no wheezes. He has no rales.  Musculoskeletal: He exhibits no edema.  Lymphadenopathy:       Head (right side): No tonsillar adenopathy present.       Head (left side): No tonsillar adenopathy present.    He has no cervical adenopathy.  Neurological: He is alert. Gait normal.  Skin: No rash noted. He is not diaphoretic. No erythema. Nails show no clubbing.  Psychiatric: Mood and affect normal.    Diagnostics: Results of blood tests obtained on 01/08/2017 identified a white blood cell count of 3.5 with a normal differential including an eosinophil count of 100, hemoglobin 15.2, platelet 217. A zone 2 aero allergen profile identified hypersensitivity with IgE antibodies against birch and oak trees and no other aeroallergens.   Spirometry was performed and demonstrated an FEV1 of 4.93 at 94 % of predicted.  Assessment and Plan:   1. Asthma, mild intermittent, well-controlled   2. Other allergic rhinitis   3. Oral allergy syndrome, subsequent encounter     1. If allergic reaction: Epi-Pen, benadryl, MD / ER  2. Every day use Benadryl 50-100 mg  3. If needed:   A. Proventil HFA 2 inhalations every 4-6 hours if needed.   4. Return to clinic in November 2018 or earlier if problem  Delvin has found a treatment that  has given rise to resolution of his upper airway and lower airway symptoms and we will continue to have him use that plan. Presently this appears to be Benadryl. He will continue on approximately 100 mg of Benadryl per day. I will be available to see him should he develop a significant problem in the face of this therapy. He has the option of utilizing various medication should he develop a significant allergic reaction or problems with his lungs as noted above.  Allena Katz, MD Allergy / Immunology [facility]

## 2017-02-07 NOTE — Patient Instructions (Signed)
  1. If allergic reaction: Epi-Pen, benadryl, MD / ER  2. Every day use Benadryl 50-100 mg  3. If needed:   A. Proventil HFA 2 inhalations every 4-6 hours if needed.   4. Return to clinic in November 2018 or earlier if problem

## 2017-02-12 ENCOUNTER — Encounter: Payer: Self-pay | Admitting: *Deleted

## 2017-03-19 ENCOUNTER — Telehealth: Payer: Self-pay | Admitting: Diagnostic Neuroimaging

## 2017-03-19 NOTE — Telephone Encounter (Signed)
Pt is having pain in the legs about 4 days to a week and wants to know if lyrica could be increased to 3 x day. Please call

## 2017-03-19 NOTE — Telephone Encounter (Signed)
Spoke with patient and informed he may increase to three times a day. He stated he has done that in the past 2 days and gotten relief of leg pain. This RN advised he continue for a week then call and let RN know if it is still effective. If so, advised Dr Leta Baptist can send in new Rx reflecting increased dose. Conformed his pharmacy. He verbalized understanding,, appreciation.

## 2017-03-19 NOTE — Telephone Encounter (Signed)
Ok to increase. -VRP

## 2017-03-26 NOTE — Telephone Encounter (Signed)
Pt called said taking lyrica 3 x day is helping. He would like new RX sent to [facility]

## 2017-03-27 MED ORDER — PREGABALIN 200 MG PO CAPS: 200.0000 mg | ORAL_CAPSULE | Freq: Three times a day (TID) | ORAL | 5 refills | Status: DC

## 2017-03-27 NOTE — Telephone Encounter (Signed)
Agree. -VRP 

## 2017-03-27 NOTE — Telephone Encounter (Addendum)
Verbal order to increase Lyrica 200 mg from twice daily to three times daily per Dr Leta Baptist. Order entered, sent for verbal , cosign required. Order signed, printed, faxed to [facility] as pt requested.  Receipt confirmed by pharmacy.

## 2017-03-27 NOTE — Addendum Note (Signed)
Addended by: Minna Antis on: 03/27/2017 04:25 PM   Modules accepted: Orders

## 2017-05-18 ENCOUNTER — Telehealth: Payer: Self-pay | Admitting: *Deleted

## 2017-05-18 NOTE — Telephone Encounter (Signed)
LVM advising patient Dr Leta Baptist reviewed the letter his wife brought  In. Advised him Dr Leta Baptist will not prescribe Cymbalta again due to reaction patient had in past. Advised Dr Leta Baptist will prescribe Sertraline (Zoloft) if patient would like to try that medication. Informed him the office is now closed, but requested he call back next week to advise. Left office number.

## 2017-05-21 MED ORDER — SERTRALINE HCL 25 MG PO TABS: 25.0000 mg | ORAL_TABLET | Freq: Every day | ORAL | 6 refills | Status: DC

## 2017-05-21 NOTE — Telephone Encounter (Signed)
Pt returned Rn's call °

## 2017-05-21 NOTE — Addendum Note (Signed)
Addended by: Andrey Spearman R on: 05/21/2017 06:38 PM   Modules accepted: Orders

## 2017-05-21 NOTE — Telephone Encounter (Signed)
Spoke with patient who would like to try Sertraline. Confirmed pharmacy of choice as [facility] [address]. He inquired about side effects, how long before it is effective. This RN advised he discuss with pharmacist when he picks up prescription. Also advised that he should get a printout of information on medication as well.  Patient asked when it will be available; this RN advised it should be by tomorrow. Patient verbalized understanding, appreciation.

## 2017-05-21 NOTE — Telephone Encounter (Signed)
I sent in rx for sertraline. -VRP

## 2017-05-29 ENCOUNTER — Ambulatory Visit: Payer: BLUE CROSS/BLUE SHIELD | Admitting: Diagnostic Neuroimaging

## 2017-05-29 ENCOUNTER — Encounter: Payer: Self-pay | Admitting: Diagnostic Neuroimaging

## 2017-08-22 ENCOUNTER — Telehealth: Payer: Self-pay | Admitting: Allergy and Immunology

## 2017-08-22 NOTE — Telephone Encounter (Signed)
Patient would like to know what he was tested for, especially if he was tested for shellfish? Please call patient to answer any questions

## 2017-08-22 NOTE — Telephone Encounter (Signed)
Left message to return call in regards to skin test we did test on shellfish and testing was negative.

## 2017-08-27 NOTE — Telephone Encounter (Signed)
Patient informed of lab results. 

## 2017-08-27 NOTE — Telephone Encounter (Signed)
Left message to call office

## 2017-10-04 ENCOUNTER — Telehealth: Payer: Self-pay | Admitting: Diagnostic Neuroimaging

## 2017-10-04 ENCOUNTER — Other Ambulatory Visit: Payer: Self-pay | Admitting: Diagnostic Neuroimaging

## 2017-10-04 NOTE — Telephone Encounter (Signed)
Faxed to [facility] with confirmation received.  Per pharmacy no PA required.   Cost for 30 day supply is $700.00 this is for BN.  Pt may [place] generic.

## 2017-10-04 NOTE — Telephone Encounter (Signed)
Seen in January and no showed 05/29/17 appt.  I gave 30 day supply and note to have pt call for appt.

## 2017-10-04 NOTE — Telephone Encounter (Signed)
I called pharmacy and no PA is needed.  Cost is $700 for one month supply.  This is cost of BN.  He is able to use generic if pts would like may decrease cost.  Needs F/u with NP (ok per Dr. Leta Baptist).

## 2017-10-04 NOTE — Telephone Encounter (Signed)
Pt states when he requested a refill of his pregabalin (LYRICA) 200 MG capsule he was told a prior authorization is needed. Pt still using  [place], [place] [phone] (Phone) [phone] (Fax)   Please call

## 2017-10-18 ENCOUNTER — Other Ambulatory Visit: Payer: Self-pay | Admitting: *Deleted

## 2017-10-18 MED ORDER — PREGABALIN 200 MG PO CAPS: 200.0000 mg | ORAL_CAPSULE | Freq: Three times a day (TID) | ORAL | 3 refills | Status: DC

## 2017-10-18 NOTE — Telephone Encounter (Signed)
I called pt and made appt for RV 01-09-18 at 0745 ( next available) with CM/NP (ok per Dr. Leta Baptist).  Last seen 11-22-2016.  Taking lyrica 200mg  po TID.  I relayed needs to keep appt to continue lyrica.

## 2017-10-19 NOTE — Telephone Encounter (Signed)
Fax confirmation received. Lyrica [facility] [phone].

## 2017-11-22 ENCOUNTER — Encounter: Payer: Self-pay | Admitting: Allergy & Immunology

## 2017-11-22 ENCOUNTER — Ambulatory Visit (INDEPENDENT_AMBULATORY_CARE_PROVIDER_SITE_OTHER): Payer: BLUE CROSS/BLUE SHIELD | Admitting: Allergy & Immunology

## 2017-11-22 VITALS — BP 118/78 | HR 69 | Ht 77.0 in | Wt 227.8 lb

## 2017-11-22 DIAGNOSIS — T781XXD Other adverse food reactions, not elsewhere classified, subsequent encounter: Secondary | ICD-10-CM | POA: Diagnosis not present

## 2017-11-22 DIAGNOSIS — J4520 Mild intermittent asthma, uncomplicated: Secondary | ICD-10-CM | POA: Diagnosis not present

## 2017-11-22 DIAGNOSIS — T7800XD Anaphylactic reaction due to unspecified food, subsequent encounter: Secondary | ICD-10-CM

## 2017-11-22 MED ORDER — CROMOLYN SODIUM 100 MG/5ML PO CONC: 100.0000 mg | Freq: Four times a day (QID) | ORAL | 0 refills | Status: AC

## 2017-11-22 NOTE — Patient Instructions (Addendum)
1. Anaphylactic shock due to food - We will get some lab work to rule out food allergies. - The shellfish reaction certainly sounds like a legitimate, but I am not sure of the others.  - Epinephrine autoinjector is up to date.  - I would continue with the Xyzal and ranitidine for one more week.   - We will add on Gastrochrom (cromolyn four times daily for 2-4 weeks to see if this provides any relief. - Try a low histamine diet (these can be found online), although the data is fairly week on this.   2. Return in about 4 weeks (around 12/20/2017).   Please inform us of any Emergency Department visits, hospitalizations, or changes in symptoms. Call us before going to the ED for breathing or allergy symptoms since we might be able to fit you in for a sick visit. Feel free to contact us anytime with any questions, problems, or concerns.  It was a pleasure to meet you today! Happy New Year!   Websites that have reliable patient information: 1. American Academy of Asthma, Allergy, and Immunology: www.aaaai.org 2. Food Allergy Research and Education (FARE): foodallergy.org 3. Mothers of Asthmatics: http://www.asthmacommunitynetwork.org 4. American College of Allergy, Asthma, and Immunology: www.acaai.org

## 2017-11-22 NOTE — Progress Notes (Signed)
FOLLOW UP  Date of Service/Encounter:  11/22/17   Assessment:   Anaphylactic shock due to food - multiple triggers (recent onset in November 2018)  Adverse food reaction - not entirely consistent with IgE mediated food allergies (with intermittent episodes for the past three years)  Intermittent asthma - well controlled without medications   Plan/Recommendations:   Mr. Rawl presents with a constellation of symptoms not entirely consistent with IgE mediated food allergies. His reaction to seafood is consistent with a food allergy with the immediate hives and responsiveness with steroids and antihistamines. However, this other reactions do not entirely fit into the food allergy realm, as the responsiveness to steroids is lacking. He is also on a massive histamine blockade without improvement in symptoms. The burning sensation makes an IgE-mediated process less likely as well. He has a history of neuropathy, therefore this could just be a worsening presentation of this. He has been worked up for various neuropathic etiologies at [facility] in [place], which has been unrevealing. He is also on Lyrica which seems to do a decent job of controlling his symptoms (at least initially). Mastocytosis is a consideration, although he does not have the GI symptoms associated with this. Idiopathic anaphylaxis is a consideration as well, although again his symptoms are not consistent with classic anaphylaxis. There is nothing in his history that would approve him for Xolair (urticaria are notably absent aside from the reaction with shellfish). I think he would benefit from a referral to an academic institution with more resources to bear, but we will try him on cromolyn to see if this can provide some relief for him in the interim. We will also rule out allergies to all of his triggering foods.   1. Anaphylactic shock due to food - We will get some lab work to rule out food allergies. - The shellfish  reaction certainly sounds like a legitimate, but I am not sure of the others.  - Epinephrine autoinjector is up to date.  - I would continue with the Xyzal and ranitidine for one more week.   - We will add on Gastrochrom (cromolyn four times daily for 2-4 weeks to see if this provides any relief. - Try a low histamine diet (these can be found online), although the data is fairly week on this.   2. Intermittent asthma, uncomplicated - Arlyce Harman was normal and Sophie has done well without any albuterol for months now.  - Review of systems is negative for all asthma symptoms. - I do not think that further spirometries are needed in Brodie's case.     3. Return in about 4 weeks (around 12/20/2017)   Subjective:   Reese Stockman is a 42 y.o. male presenting today for follow up of  Chief Complaint  Patient presents with  . Allergic Reaction    Pt presents to discuss rash he had about 3 weeks ago after eating shellfish.Pt had another issue with carrots that are not organic.    Raju Dunford has a history of the following: Patient Active Problem List   Diagnosis Date Noted  . Neuropathy 05/09/2016  . Hereditary and idiopathic peripheral neuropathy 05/09/2016    History obtained from: chart review and patient.  Darien Ramus Primary Care Provider is Vernie Shanks, MD.     Rayson is a 42 y.o. male presenting for a follow up visit. He was last seen in March 2018 by Dr. Neldon Mc. At that time, it seems that Dr. Raliegh Ip was contributing his symptoms  to oral allergy syndrome. He also has complained about chest tightness and constriction, therefore he was started on ICS for presumed asthma. However, he stopped those medications since he felt no relief from them. He instead was using Benadryl BID to treat his symptoms with improvement. He does carry a diagnosis of almond, carrot, green pepper allergy. Carrots that are not organic seem to be the big triggers. Last environmental allergy testing was performed in 2018  and was positive to birch and oak.   Since the last visit, he has become concerned with food allergies. He is concerned today with strawberry, carrot, and shellfish allergies. Over the past month, he has started having increased frequency of reactions. Over Thanksgiving, he had a reaction to shellfish (possibly crab). He had hives over his entire body and he went to Urgent Care and he received a steroid injection. He did not have breathing problems or stomach pain at that time. He had no systemic symptoms with this aside from the rashes.   The second episode he had was during Christmas. At that time, his mother made a carrot salad. He carries a diagnosis of a carrot allergy, but has been able to tolerate only organic carrots. However his mother did not use organic carrots. He developed tightness in his chest and lump in his throat with labored breathing. He also had a "burning" inside his chest and burning on his feet. He went back to Urgent Care and got a steroid injection which actually did not help as much as with the shellfish. He was on steroids for seven days following the injection.  He also reports that he ate pizza one night over the holiday. This was a supreme pizza with a variety of toppings. He has also had two more reactions since that time. All of the reactions include thirst, burning in the chest, and throat tightness. By the time these occurred, he was already on steroids from the carrot reaction and he just went on with it.   Then on New Year's Day, he had quesadilla with cheese and whole wheat tortillas. He also had BBQ sauce with chicken. This resulted in another reaction. He also ate oatmeal with strawberries as well as bread with sausage, egg, and cheese.   He did call Dr. Raliegh Ip who recommended ranitidine QID as well as Xyzal 5mg  BID. He was also placed on Benadryl as needed. He had a reaction despite this, but he is unsure whether this is helping much. There is a possibility that this is  helping to keep the symptoms somewhat controlled.   Of note, Maxim has a history of neuropathy that has been worked up by [facility] in [place], [place]. He had a large auto-immune work up that was negative (some of this is available in [place]). Currently he is Lyrica for this issue. He did have a workup for Lyme disease which was negative.   Otherwise, there have been no changes to his past medical history, surgical history, family history, or social history.    Review of Systems: a 14-point review of systems is pertinent for what is mentioned in HPI.  Otherwise, all other systems were negative. Constitutional: negative other than that listed in the HPI Eyes: negative other than that listed in the HPI Ears, nose, mouth, throat, and face: negative other than that listed in the HPI Respiratory: negative other than that listed in the HPI Cardiovascular: negative other than that listed in the HPI Gastrointestinal: negative other than that listed in the  HPI Genitourinary: negative other than that listed in the HPI Integument: negative other than that listed in the HPI Hematologic: negative other than that listed in the HPI Musculoskeletal: negative other than that listed in the HPI Neurological: negative other than that listed in the HPI Allergy/Immunologic: negative other than that listed in the HPI    Objective:   Blood pressure 118/78, pulse 69, height 6\' 5"  (1.956 m), weight 227 lb 12.8 oz (103.3 kg), SpO2 96 %. Body mass index is 27.01 kg/m.   Physical Exam:  General: Alert, interactive, in no acute distress. Pleasant. Non-toxic appearing.  Eyes: No conjunctival injection bilaterally, no discharge on the right, no discharge on the left and no Horner-Trantas dots present. PERRL bilaterally. EOMI without pain. No photophobia.  Ears: Right TM pearly gray with normal light reflex, Left TM pearly gray with normal light reflex, Right TM intact without perforation and  Left TM intact without perforation.  Nose/Throat: External nose within normal limits and septum midline. Turbinates edematous with clear discharge. Posterior oropharynx mildly erythematous without cobblestoning in the posterior oropharynx. Tonsils 2+ without exudates.  Tongue without thrush. Adenopathy: no enlarged lymph nodes appreciated in the anterior cervical, occipital, axillary, epitrochlear, inguinal, or popliteal regions. Lungs: Clear to auscultation without wheezing, rhonchi or rales. No increased work of breathing. CV: Normal S1/S2. No murmurs. Capillary refill <2 seconds.  Skin: Warm and dry, without lesions or rashes. Neuro:   Grossly intact. No focal deficits appreciated. Responsive to questions.  Diagnostic studies:  Spirometry: results normal (FEV1: 4.48/91%, FVC: 6.17/98%, FEV1/FVC: 73%).    Spirometry consistent with normal pattern.    Allergy Studies: none     Salvatore Marvel, MD [facility]

## 2017-11-23 LAB — H. PYLORI ANTIBODY, IGG: H. pylori, IgG AbS: <0.8 Index Value (ref 0.00–0.79)

## 2017-11-25 LAB — COMPREHENSIVE METABOLIC PANEL
A/G RATIO: 1.5 (ref 1.2–2.2)
ALK PHOS: 75 IU/L (ref 39–117)
ALT: 48 IU/L — AB (ref 0–44)
AST: 24 IU/L (ref 0–40)
Albumin: 4.2 g/dL (ref 3.5–5.5)
BILIRUBIN TOTAL: 0.4 mg/dL (ref 0.0–1.2)
BUN/Creatinine Ratio: 15 (ref 9–20)
BUN: 21 mg/dL (ref 6–24)
CHLORIDE: 98 mmol/L (ref 96–106)
CO2: 25 mmol/L (ref 20–29)
Calcium: 9.5 mg/dL (ref 8.7–10.2)
Creatinine, Ser: 1.37 mg/dL — ABNORMAL HIGH (ref 0.76–1.27)
GFR calc non Af Amer: 63 mL/min/{1.73_m2} (ref >59)
GFR, EST AFRICAN AMERICAN: 73 mL/min/{1.73_m2} (ref >59)
Globulin, Total: 2.8 g/dL (ref 1.5–4.5)
Glucose: 72 mg/dL (ref 65–99)
POTASSIUM: 4.2 mmol/L (ref 3.5–5.2)
Sodium: 139 mmol/L (ref 134–144)
Total Protein: 7 g/dL (ref 6.0–8.5)

## 2017-11-25 LAB — MILK COMPONENT PANEL: F076-IgE Alpha Lactalbumin: <0.1 kU/L

## 2017-11-25 LAB — CBC WITH DIFFERENTIAL/PLATELET
Basophils Absolute: 0 10*3/uL (ref 0.0–0.2)
Basos: 0 %
EOS (ABSOLUTE): 0.2 10*3/uL (ref 0.0–0.4)
Eos: 3 %
Hematocrit: 44.6 % (ref 37.5–51.0)
Hemoglobin: 15.3 g/dL (ref 13.0–17.7)
IMMATURE GRANS (ABS): 0 10*3/uL (ref 0.0–0.1)
Immature Granulocytes: 1 %
LYMPHS: 27 %
Lymphocytes Absolute: 1.6 10*3/uL (ref 0.7–3.1)
MCH: 32.2 pg (ref 26.6–33.0)
MCHC: 34.3 g/dL (ref 31.5–35.7)
MCV: 94 fL (ref 79–97)
Monocytes Absolute: 0.6 10*3/uL (ref 0.1–0.9)
Monocytes: 10 %
NEUTROS ABS: 3.5 10*3/uL (ref 1.4–7.0)
NEUTROS PCT: 59 %
Platelets: 265 10*3/uL (ref 150–379)
RBC: 4.75 x10E6/uL (ref 4.14–5.80)
RDW: 14.1 % (ref 12.3–15.4)
WBC: 5.8 10*3/uL (ref 3.4–10.8)

## 2017-11-25 LAB — ALLERGEN, STRAWBERRY, F44

## 2017-11-25 LAB — ALLERGEN PROFILE, SHELLFISH
Clam IgE: <0.1 kU/L
F023-IgE Crab: <0.1 kU/L
Scallop IgE: <0.1 kU/L
Shrimp IgE: <0.1 kU/L

## 2017-11-25 LAB — C-REACTIVE PROTEIN: CRP: 2.4 mg/L (ref 0.0–4.9)

## 2017-11-25 LAB — TRYPTASE: TRYPTASE: 6.8 ug/L (ref 2.2–13.2)

## 2017-11-25 LAB — SEDIMENTATION RATE: Sed Rate: 11 mm/hr (ref 0–15)

## 2017-11-25 LAB — ALLERGEN CARROT

## 2017-11-25 LAB — ALLERGEN, WHEAT, F4: Wheat IgE: <0.1 kU/L

## 2017-12-12 ENCOUNTER — Ambulatory Visit: Payer: Self-pay | Admitting: Nurse Practitioner

## 2018-01-08 ENCOUNTER — Telehealth: Payer: Self-pay | Admitting: Diagnostic Neuroimaging

## 2018-01-08 MED ORDER — PREGABALIN 200 MG PO CAPS: 200.0000 mg | ORAL_CAPSULE | Freq: Two times a day (BID) | ORAL | 0 refills | Status: DC

## 2018-01-08 NOTE — Telephone Encounter (Signed)
Lyrica refill Rx successfully faxed to [facility].

## 2018-01-08 NOTE — Telephone Encounter (Signed)
Lyrica Rx changed to reflect take 200 mg cap two times daily as patient requested. This is a cost savings for patient. Patient must keep FU with NP on 01/22/18 to receive further refills.

## 2018-01-08 NOTE — Telephone Encounter (Signed)
Pts wife calling stating they need a prescription for pregabalin (LYRICA) 200 MG capsule twice a day instead of 3 times daily sent to [facility]

## 2018-01-09 ENCOUNTER — Ambulatory Visit: Payer: Self-pay | Admitting: Nurse Practitioner

## 2018-01-21 NOTE — Progress Notes (Signed)
[facility]  PATIENT: Mark Morton DOB: [birth year]   REASON FOR VISIT: Follow-up for neuropathy HISTORY FROM: Patient    HISTORY OF PRESENT ILLNESS:UPDATE 3/2019CM Mark Morton, 42 year old male returns for follow-up with history of idiopathic peripheral neuropathy.  He is currently on Lyrica 200 twice daily with good control of his symptoms which are burning pins and needles sensation.  He continues to follow-up with a physician at [facility] for his multiple allergies and he takes Benadryl at night.  He has failed gabapentin and Cymbalta in the past.  He returns for reevaluation UPDATE 11/22/16: Since last visit, doing about the same. Back on lyrica (works better than gabapentin or cymbalta). Working on allergy issues with integrative clinic ([facility]; [place] Pain levels stable.   UPDATE 08/14/16: Since last visit, having more idiopathic allergy reactions (last 2 weeks ago; needed epi pen and benadryl at night; no clear trigger). Saw allergist --> now on claritin, zantac and singulair. In terms of neuropathy, lyrica was helping, but now he is wondering if lyrica may be contributing to allergic reactions. Has tried elimination diets without success.   UPDATE 05/09/16: Since last visit, had 4th opinion at [facility], no clear diagnosis. Patient had repeat EMG nerve conduction study which demonstrated mild evidence of peripheral polyneuropathy. Also found to have mild right carpal tunnel syndrome. Autonomic and small fiber neuropathy testing including QSART response shows mild impairment of adrenergic and cardio vagal responses however postganglionic pseudomotor responses were normal. Additional lab testing including sedimentation rate, protein electrophoresis, hemoglobin A1c, paraneoplastic panel, autoimmune antibody testing, were all negative. Patient was diagnosed with idiopathic small and large fiber neuropathy and advised to follow-up with local neurology for symptom  control. Still with burning pain in feet and low back. Tolerating Lyrica 200 mg 3 times a day.  UPDATE 01/25/16: Since last visit patient continues to have symptoms. Had second opinion with [facility]. Records reviewed and summarized: suspected small fiber; EMG/NCS was repeated at [facility] but results not viewable in system. Patient says that very mild abnl found, but not enough to explain degree of pain.  UPDATE 11/09/15: Since last visit, having more pain/burning in feet. 2 nights ago, had sudden increase in pain and sxs. Also working with allergist re: food allergies / sensitivities. Also with 2 ER visits for poss allergic reactions. Also with hand abnl sensations (cold).   PRIOR HPI (09/28/15): 42 year old male here for evaluation of numbness and tingling feet. For past 2 years patient has had mild tingling in bilateral toes and feet. Over past 2 months this has suddenly increased burning sensation. Also he had noticed some weakness in his hands. Patient went outside neurologist who checked EMG nerve conduction study which apparently showed neuropathy. He had some blood testing done which apparently were unremarkable. He was treated for carpal tunnel syndrome with hand splints and treated with epidural steroid injection for suspected lumbar radiculopathy. Patient requests a second opinion with me for further evaluation. Patient has been on Lyrica 100 mg twice a day with mild relief of painful symptoms. 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 He has remote history of lumbar radiculopathy radiating to the right leg status post discectomy in 1996.   REVIEW OF SYSTEMS: Full 14 system review of systems performed and notable only for those listed, all others are neg:  Constitutional: neg  Cardiovascular: neg Ear/Nose/Throat: neg  Skin: neg Eyes: neg Respiratory: neg Gastroitestinal:  neg  Hematology/Lymphatic: neg  Endocrine:  neg Musculoskeletal:neg Allergy/Immunology: neg Neurological: Burning tingling sensations in the feet Psychiatric: neg Sleep : neg   ALLERGIES: Allergies  Allergen Reactions  . Almond (Diagnostic) Swelling    Swelling of throat  . Carrot [Daucus Carota] Shortness Of Breath    Can eat cooked carrots   . Other Swelling    Green peppers, swelling of throat, unsure of this allergy  . Duloxetine Other (See Comments)    Other, throat swelling, tightening of inner body  . Gabapentin Other (See Comments)    Other, throat swelling, tightness of inner body    HOME MEDICATIONS: Outpatient Medications Prior to Visit  Medication Sig Dispense Refill  . diphenhydrAMINE (BENADRYL) 25 MG tablet Take 2 tablets (50 mg total) by mouth every 6 (six) hours as needed for itching or allergies. (Patient taking differently: Take 50 mg by mouth every 6 (six) hours as needed for itching or allergies. Patient reports taking 6-8 tablets every night) 20 tablet 0  . pregabalin (LYRICA) 200 MG capsule Take 1 capsule (200 mg total) by mouth 2 (two) times daily. 60 capsule 0  . sildenafil (VIAGRA) 100 MG tablet Take 100 mg by mouth daily as needed for erectile dysfunction.    Marland Kitchen albuterol (PROVENTIL HFA;VENTOLIN HFA) 108 (90 Base) MCG/ACT inhaler Inhale two puffs every four to six hours as needed for cough or wheeze. 1 Inhaler 1  . ranitidine (ZANTAC) 150 MG capsule Take 150 mg by mouth 4 (four) times daily.     No facility-administered medications prior to visit.     PAST MEDICAL HISTORY: Past Medical History:  Diagnosis Date  . Allergy   . Anxiety   . Chronic kidney disease    Kidney stone  . Depression   . Neuropathy     PAST SURGICAL HISTORY: Past Surgical History:  Procedure Laterality Date  . BACK SURGERY  1996  . CYSTOSCOPY WITH RETROGRADE PYELOGRAM, URETEROSCOPY AND STENT PLACEMENT Left 06/19/2015   Procedure: CYSTOSCOPY WITH RETROGRADE PYELOGRAM, LEFT URETEROSCOPY, STONE BASKETRY AND LEFT JJ  STENT PLACEMENT;  Surgeon: Carolan Clines, MD;  Location: [facility];  Service: Urology;  Laterality: Left;  . LUMBAR DISC SURGERY      FAMILY HISTORY: Family History  Problem Relation Age of Onset  . Neuropathy Father   . Cancer Maternal Grandmother   . Diabetes Maternal Grandfather   . Hypertension Maternal Grandfather     SOCIAL HISTORY: Social History   Socioeconomic History  . Marital status: Married    Spouse name: Mary  . Number of children: 0  . Years of education: 6  . Highest education level: Not on file  Social Needs  . Financial resource strain: Not on file  . Food insecurity - worry: Not on file  . Food insecurity - inability: Not on file  . Transportation needs - medical: Not on file  . Transportation needs - non-medical: Not on file  Occupational History    Comment: carpentry  Tobacco Use  . Smoking status: Never Smoker  . Smokeless tobacco: Never Used  Substance and Sexual Activity  . Alcohol use: No    Comment: occ  . Drug use: No  . Sexual activity: Yes    Partners: Female    Birth control/protection: None  Other Topics Concern  . Not on file  Social History Narrative   Lives at home with wife   No caffeine      PHYSICAL EXAM  Vitals:   01/22/18 1527  BP: 106/68  Pulse: 75  Weight: 231 lb 9.6 oz (105.1 kg)  Height: 6\' 5"  (1.956 m)   Body mass index is 27.46 kg/m.  Generalized: Well developed, in no acute distress , well-groomed Head: normocephalic and atraumatic,. Oropharynx benign  Neck: Supple,  Musculoskeletal: No deformity   Neurological examination   Mentation: Alert oriented to time, place, history taking. Attention span and concentration appropriate. Recent and remote memory intact.  Follows all commands speech and language fluent.   Cranial nerve II-XII: Pupils were equal round reactive to light extraocular movements were full, visual field were full on confrontational test. Facial sensation and strength were normal.  hearing was intact to finger rubbing bilaterally. Uvula tongue midline. head turning and shoulder shrug were normal and symmetric.Tongue protrusion into cheek strength was normal. Motor: normal bulk and tone, full strength in the BUE, BLE, fine finger movements normal, no pronator drift. No focal weakness Sensory: normal and symmetric to light touch, pinprick, and  Vibration except slight decreased pinprick in bottom of feet  distally,   Coordination: finger-nose-finger, heel-to-shin bilaterally, no dysmetria Reflexes: UE 1+ right knee trace left knee 1 ankles absent  Gait and Station: Rising up from seated position without assistance, normal stance,  moderate stride, good arm swing, smooth turning, able to perform tiptoe, and heel walking without difficulty. Tandem gait is steady  DIAGNOSTIC DATA (LABS, IMAGING, TESTING) - I reviewed patient records, labs, notes, testing and imaging myself where available.  Lab Results  Component Value Date   WBC 5.8 11/22/2017   HGB 15.3 11/22/2017   HCT 44.6 11/22/2017   MCV 94 11/22/2017   PLT 265 11/22/2017      Component Value Date/Time   NA 139 11/22/2017 0938   K 4.2 11/22/2017 0938   CL 98 11/22/2017 0938   CO2 25 11/22/2017 0938   GLUCOSE 72 11/22/2017 0938   GLUCOSE 89 03/17/2016 2316   BUN 21 11/22/2017 0938   CREATININE 1.37 (H) 11/22/2017 0938   CREATININE 1.15 10/23/2012 2106   CALCIUM 9.5 11/22/2017 0938   PROT 7.0 11/22/2017 0938   ALBUMIN 4.2 11/22/2017 0938   AST 24 11/22/2017 0938   ALT 48 (H) 11/22/2017 0938   ALKPHOS 75 11/22/2017 0938   BILITOT 0.4 11/22/2017 0938   GFRNONAA 63 11/22/2017 0938   GFRAA 73 11/22/2017 0938    ASSESSMENT AND PLAN 42 y.o. year old male here with new onset lower extremity numbness tingling burning sensation since 2014. Symptoms affecting upper and lower extremities. Extensive testing unremarkable so far (including 4 neurologists evaluations; including [facility] and [facility] evaluation).     Tried and failed: gabapentin, cymbalta, topical neuropathy cream   Dx: neuropathy (idiopathic small fiber) Hereditary and idiopathic peripheral neuropathy Areflexia    PLAN: Continue lyrica 200mg  twice a day will refill Continue allergy treatments Follow-up yearly and as needed Dennie Bible, [facility], [facility], APRN  [facility] [address] [place], [place] [address] [phone]

## 2018-01-22 ENCOUNTER — Ambulatory Visit (INDEPENDENT_AMBULATORY_CARE_PROVIDER_SITE_OTHER): Payer: BLUE CROSS/BLUE SHIELD | Admitting: Nurse Practitioner

## 2018-01-22 ENCOUNTER — Encounter: Payer: Self-pay | Admitting: Nurse Practitioner

## 2018-01-22 VITALS — BP 106/68 | HR 75 | Ht 77.0 in | Wt 231.6 lb

## 2018-01-22 DIAGNOSIS — G609 Hereditary and idiopathic neuropathy, unspecified: Secondary | ICD-10-CM

## 2018-01-22 DIAGNOSIS — G629 Polyneuropathy, unspecified: Secondary | ICD-10-CM | POA: Diagnosis not present

## 2018-01-22 MED ORDER — PREGABALIN 200 MG PO CAPS: 200.0000 mg | ORAL_CAPSULE | Freq: Two times a day (BID) | ORAL | 5 refills | Status: DC

## 2018-01-22 NOTE — Progress Notes (Signed)
Lyrica refill Rx successfully faxed to [facility].

## 2018-01-22 NOTE — Progress Notes (Signed)
I reviewed note and agree with plan.   Penni Bombard, MD 03/25/9793, 8:01 PM Certified in Neurology, Neurophysiology and Neuroimaging  [facility] [address] [place], [place] [address] [phone]

## 2018-01-22 NOTE — Patient Instructions (Signed)
Continue lyrica 200mg  twice a day will refill Continue allergy treatments Follow-up yearly and as needed

## 2018-01-29 ENCOUNTER — Telehealth: Payer: Self-pay | Admitting: Diagnostic Neuroimaging

## 2018-01-29 NOTE — Telephone Encounter (Signed)
Noted submitted . Patient aware form faxed.

## 2018-01-29 NOTE — Telephone Encounter (Signed)
Paper work for Patient assistance pregabalin (LYRICA) 200 MG capsule . Print hard copy and Dr. Leta Baptist please sign [place] Patient assistance program . Telephone [phone]- fax [phone].  Placed on Sandy's desk.

## 2018-01-29 NOTE — Telephone Encounter (Signed)
Hard copy retrieved from 01-22-18 visit.  Need signature, then to [place].

## 2018-01-29 NOTE — Telephone Encounter (Signed)
Signed and given to [employer].

## 2018-01-29 NOTE — Addendum Note (Signed)
Addended by: Oliver Hum S on: 01/29/2018 11:40 AM   Modules accepted: Orders

## 2018-03-04 ENCOUNTER — Telehealth: Payer: Self-pay | Admitting: Nurse Practitioner

## 2018-03-04 NOTE — Telephone Encounter (Signed)
Pt wife(on DPR) has called this is a continuation of message from 01-29-2018.  She states there was never an original prescription for the pregabalin (LYRICA) 200 MG capsule.  Pt wife is asking that it be the one for 3 times a day.  Wife states it needs to be faxed to [facility] their fax is # [phone]

## 2018-03-04 NOTE — Telephone Encounter (Addendum)
I spoke to wife, Stanton Kidney and relayed that last prescription was for 200mg  po BID, and was sent but can resend as Estate manager/land agent did not have hard copy of prescription.  Will forward to [place].  Pt assistance.   Pt had been on lyrica 200mg  po TID back in 2017, but had issue with allergic rx? Throat swelling and has been back on BID.  She verbalized understanding.

## 2018-03-05 NOTE — Telephone Encounter (Signed)
RX resent to [facility] - resend the hard copy prescription.to [place] 200 mg

## 2018-03-06 MED ORDER — PREGABALIN 200 MG PO CAPS: 200.0000 mg | ORAL_CAPSULE | Freq: Two times a day (BID) | ORAL | 0 refills | Status: DC

## 2018-03-06 NOTE — Addendum Note (Signed)
Addended by: Oliver Hum S on: 03/06/2018 11:15 AM   Modules accepted: Orders

## 2018-03-06 NOTE — Telephone Encounter (Signed)
Pt has called to inform that he is working with an Armed forces training and education officer for funding re: his Lyrica.  Pt is asking if it is possible for him to get a 5 day prescription for his Lyrica while he waits for funding for the Lyrica.  Please call

## 2018-03-06 NOTE — Addendum Note (Signed)
Addended by: Otilio Jefferson on: 03/06/2018 04:38 PM   Modules accepted: Orders

## 2018-03-07 NOTE — Telephone Encounter (Signed)
Fax confirmation received  [facility] [phone]. lyrica 5 days.

## 2018-03-25 NOTE — Telephone Encounter (Signed)
Pt called requesting the dosing for Lyrica would like to have a new prescription for 3 times daily sent to [place] [phone]. Please call to advise

## 2018-03-25 NOTE — Addendum Note (Signed)
Addended by: Minna Antis on: 03/25/2018 04:43 PM   Modules accepted: Orders

## 2018-03-25 NOTE — Telephone Encounter (Signed)
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 He stated on his most difficult days he'd like to be "able to push through " his work. This RN advised will send his request to [place]. He will only get a call back if she does not agree to increase. He verbalized understanding, appreciation. New Rx will need to be faxed to [place] where he gets patient assistance.

## 2018-03-26 ENCOUNTER — Other Ambulatory Visit: Payer: Self-pay | Admitting: Nurse Practitioner

## 2018-03-26 MED ORDER — AMITRIPTYLINE HCL 25 MG PO TABS: 25.0000 mg | ORAL_TABLET | Freq: Every day | ORAL | 5 refills | Status: DC

## 2018-03-26 MED ORDER — PREGABALIN 200 MG PO CAPS
200.0000 mg | ORAL_CAPSULE | Freq: Two times a day (BID) | ORAL | 1 refills | Status: DC
Start: 2018-03-26 — End: 2018-08-26

## 2018-03-26 MED ORDER — PREGABALIN 200 MG PO CAPS: 200.0000 mg | ORAL_CAPSULE | Freq: Two times a day (BID) | ORAL | 1 refills | Status: DC

## 2018-03-26 NOTE — Telephone Encounter (Signed)
Spoke with patient and advised him, per Daun Peacock NP increasing Lyrica has not proven to be anymore effective than the dose he is currently taking. Advised him per Dr Gladstone Lighter note she has prescribed Amitriptyline 25 mg, take one at bedtime. Advised he take for a few weeks then call back with update. Rx sent to [facility] at patient's request. He verbalized understanding , appreciation.

## 2018-03-26 NOTE — Telephone Encounter (Signed)
Yes with 1 refill

## 2018-03-26 NOTE — Telephone Encounter (Addendum)
Lyrica refill Rx for 90 day supply, 1 refill successfully faxed to [facility].  Notified patient; he verbalized understanding.

## 2018-03-26 NOTE — Telephone Encounter (Signed)
Let us try add on of amitriptyline at bedtime.Where does rx need to be sent . I have updated meds.

## 2018-03-26 NOTE — Telephone Encounter (Signed)
Lyrica refill Rx fo r90 day supply printed, for signature.

## 2018-03-26 NOTE — Telephone Encounter (Signed)
Pt called back wanting to a 90 day supply for the Lyrica. Please call to advise

## 2018-03-26 NOTE — Addendum Note (Signed)
Addended by: Minna Antis on: 03/26/2018 01:51 PM   Modules accepted: Orders

## 2018-03-26 NOTE — Addendum Note (Signed)
Addended by: Minna Antis on: 03/26/2018 10:23 AM   Modules accepted: Orders

## 2018-07-23 ENCOUNTER — Encounter: Payer: Self-pay | Admitting: Nurse Practitioner

## 2018-08-26 ENCOUNTER — Other Ambulatory Visit: Payer: Self-pay | Admitting: Nurse Practitioner

## 2018-08-26 MED ORDER — PREGABALIN 200 MG PO CAPS: 200.0000 mg | ORAL_CAPSULE | Freq: Two times a day (BID) | ORAL | 1 refills | Status: DC

## 2018-08-26 NOTE — Addendum Note (Signed)
Addended by: Brandon Melnick on: 08/26/2018 05:05 PM   Modules accepted: Orders

## 2018-08-26 NOTE — Addendum Note (Signed)
Addended by: Brandon Melnick on: 08/26/2018 04:32 PM   Modules accepted: Orders

## 2018-08-26 NOTE — Telephone Encounter (Signed)
LMVM for [facility] to cancel the prescription for lyrica, pt to get elsewhere.

## 2018-08-26 NOTE — Telephone Encounter (Signed)
Patient requesting refill of pregabalin (LYRICA) 200 MG capsule 90 day supply sent to [facility] fax# [phone].

## 2018-08-26 NOTE — Telephone Encounter (Signed)
Last fill 06/08/2018 for 90 days with [facility].

## 2018-08-27 NOTE — Telephone Encounter (Signed)
Fax confirmation received [facility] [phone].

## 2019-01-27 ENCOUNTER — Ambulatory Visit: Payer: BLUE CROSS/BLUE SHIELD | Admitting: Nurse Practitioner

## 2019-01-27 ENCOUNTER — Ambulatory Visit: Payer: BLUE CROSS/BLUE SHIELD | Admitting: Diagnostic Neuroimaging

## 2021-12-12 ENCOUNTER — Other Ambulatory Visit: Payer: Self-pay

## 2021-12-12 ENCOUNTER — Ambulatory Visit (INDEPENDENT_AMBULATORY_CARE_PROVIDER_SITE_OTHER): Payer: 59 | Admitting: Podiatry

## 2021-12-12 ENCOUNTER — Ambulatory Visit (INDEPENDENT_AMBULATORY_CARE_PROVIDER_SITE_OTHER): Payer: 59

## 2021-12-12 ENCOUNTER — Encounter: Payer: Self-pay | Admitting: Podiatry

## 2021-12-12 DIAGNOSIS — M7752 Other enthesopathy of left foot: Secondary | ICD-10-CM

## 2021-12-12 DIAGNOSIS — M7751 Other enthesopathy of right foot: Secondary | ICD-10-CM | POA: Diagnosis not present

## 2021-12-12 DIAGNOSIS — G629 Polyneuropathy, unspecified: Secondary | ICD-10-CM

## 2021-12-12 MED ORDER — TRIAMCINOLONE ACETONIDE 10 MG/ML IJ SUSP
20.0000 mg | Freq: Once | INTRAMUSCULAR | Status: AC
  Administered 2021-12-12: 20 mg

## 2021-12-12 NOTE — Progress Notes (Signed)
Subjective:   Patient ID: Mark Morton, male   DOB: 46 y.o.   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  States that he has been diagnosed with small fiber neuropathy and has had previous nerve biopsies and also has problems with his legs in general.  Patient does not smoke and tries to be active   Review of Systems  All other systems reviewed and are negative.      Objective:  Physical Exam Vitals and nursing note reviewed.  Constitutional:      Appearance: He is well-developed.  Pulmonary:     Effort: Pulmonary effort is normal.  Musculoskeletal:        General: Normal range of motion.  Skin:    General: Skin is warm.  Neurological:     Mental Status: He is alert.    Neurovascular status found to be intact muscle strength was adequate range of motion adequate with patient found to have inflammation centered around the third and fourth metatarsal phalangeal joints bilaterally does have diminishment of sharp dull vibratory bilateral.  Patient is found to have good digital perfusion is well oriented x3 with mild structural deformities of the lesser digits    Assessment:  Chronic neuropathy but also appears to have inflammatory condition which may be contributory to problems     Plan:  H&P education rendered concerning neuropathy and the consideration for more advanced treatments he can consider in future.  Today I went ahead and I did blocks of the feet right and left and then aspirated the third and fourth MPJ getting out a small amount of clear fluid and injecting each joint with quarter cc dexamethasone Kenalog third and fourth bilateral and advised on rigid bottom shoes.  Reappoint 6 weeks and we will decide what else we can do at that point depending on response  X-rays indicate that there is slight elongation third metatarsal bilateral no indications of advanced  arthritis stress fracture

## 2022-01-05 ENCOUNTER — Encounter: Payer: Self-pay | Admitting: Gastroenterology

## 2022-01-23 ENCOUNTER — Ambulatory Visit: Payer: 59 | Admitting: Podiatry

## 2022-01-23 ENCOUNTER — Telehealth: Payer: Self-pay | Admitting: *Deleted

## 2022-01-23 NOTE — Telephone Encounter (Signed)
Patient did not call us back. Called patient again, no answer,left message. PV and colon cancelled-no show letter mailed.  ?

## 2022-01-23 NOTE — Telephone Encounter (Signed)
Patient no show/answer PV appointment for today. I called patient x3, no answer, left a message for the patient to call us back today before 5 pm or the PV and procedure will be cancelled. ? ?

## 2022-02-02 ENCOUNTER — Ambulatory Visit: Payer: 59 | Admitting: Podiatry

## 2022-02-06 ENCOUNTER — Encounter: Payer: Self-pay | Admitting: Gastroenterology

## 2022-02-14 ENCOUNTER — Other Ambulatory Visit: Payer: Self-pay

## 2022-02-14 ENCOUNTER — Ambulatory Visit (AMBULATORY_SURGERY_CENTER): Payer: 59 | Admitting: *Deleted

## 2022-02-14 VITALS — Ht 77.0 in | Wt 195.0 lb

## 2022-02-14 DIAGNOSIS — Z1211 Encounter for screening for malignant neoplasm of colon: Secondary | ICD-10-CM

## 2022-02-14 MED ORDER — PEG 3350-KCL-NA BICARB-NACL 420 G PO SOLR: 4000.0000 mL | Freq: Once | ORAL | 0 refills | Status: AC

## 2022-02-14 NOTE — Progress Notes (Signed)
No egg or soy allergy known to patient  ?No issues known to pt with past sedation with any surgeries or procedures ?Patient denies ever being told they had issues or difficulty with intubation  ?No FH of Malignant Hyperthermia ?Pt is not on diet pills ?Pt is not on  home 02  ?Pt is not on blood thinners  ?Pt denies issues with constipation  ?No A fib or A flutter ? ?Pt is fully vaccinated  for Covid  ? NO PA's for preps discussed with pt In PV today  ?Discussed with pt there will be an out-of-pocket cost for prep and that varies from $0 to 70 +  dollars - pt verbalized understanding  ? ?Due to the COVID-19 pandemic we are asking patients to follow certain guidelines in PV and the [facility]   ?Pt aware of COVID protocols and [facility] guidelines  ? ?PV completed over the phone. Pt verified name, DOB, address and insurance during PV today.  ?Pt mailed instruction packet with copy of consent form to read and not return, and instructions.  ?Pt encouraged to call with questions or issues.  ?If pt has My chart, procedure instructions sent via My Chart  ? ?

## 2022-02-15 ENCOUNTER — Encounter: Payer: Self-pay | Admitting: *Deleted

## 2022-02-17 ENCOUNTER — Ambulatory Visit: Payer: 59 | Admitting: Diagnostic Neuroimaging

## 2022-02-17 ENCOUNTER — Encounter: Payer: Self-pay | Admitting: Diagnostic Neuroimaging

## 2022-02-17 VITALS — BP 99/59 | HR 62 | Ht 77.0 in | Wt 188.8 lb

## 2022-02-17 DIAGNOSIS — M79671 Pain in right foot: Secondary | ICD-10-CM

## 2022-02-17 DIAGNOSIS — M79672 Pain in left foot: Secondary | ICD-10-CM

## 2022-02-17 DIAGNOSIS — G629 Polyneuropathy, unspecified: Secondary | ICD-10-CM

## 2022-02-17 MED ORDER — PREGABALIN 100 MG PO CAPS: 100.0000 mg | ORAL_CAPSULE | Freq: Two times a day (BID) | ORAL | 5 refills | Status: DC

## 2022-02-17 NOTE — Patient Instructions (Signed)
?  BILATERAL FOOT PAIN / NEUROPATHY ?- restart lyrica '100mg'$  twice a day  ?- refer to rheumatology (eval joint, connective tissue) ?- trial of plantar fasciitis exercises (per podiatry and PT) ?

## 2022-02-17 NOTE — Progress Notes (Signed)
? ?[facility] ? ?PATIENT: Mark Morton ?DOB: 1976/10/21 ? ?REFERRING CLINICIAN: Loura Halt A, NP ?HISTORY FROM: patient  ?REASON FOR VISIT: new consult ? ? ?HISTORICAL ? ?CHIEF COMPLAINT:  ?Chief Complaint  ?Patient presents with  ? New Patient (Initial Visit)  ?  RM 6, alone. Referral for peripheral neuropathy. Pt reports this is all over his body, mostly in his feet. He is having pain in bottoms of feet. Wondering if there is a medication that can help with this. 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 Feels this is where pain is coming from.  ? ? ?HISTORY OF PRESENT ILLNESS:  ? ?UPDATE (02/17/22, VRP): Since last visit, went off lyrica 3 years ago due to lack of benefit and fatigue. Was doing about the same until last few months, now with different type of pain in bottom of feet (pressure, pain, heel to toes).  Worse when he stands and walks for a long time.  Worse with pressure on his feet.  Still has suboptimal sleep averaging 5 to 7 hours per night.  Went to podiatry and had some injections with did not help.  Has pain in his joints of his feet as well. ? ?UPDATE (01/2018 CM) Mark Morton, 46 year old male returns for follow-up with history of idiopathic peripheral neuropathy.  He is currently on Lyrica 200 twice daily with good control of his symptoms which are burning pins and needles sensation.  He continues to follow-up with a physician at [facility] for his multiple allergies and he takes Benadryl at night.  He has failed gabapentin and Cymbalta in the past.  He returns for reevaluation ? ?UPDATE 11/22/16: Since last visit, doing about the same. Back on lyrica (works better than gabapentin or cymbalta). Working on allergy issues with integrative clinic ([facility]; [place]). Pain levels stable.  ?  ?UPDATE 08/14/16: Since last visit, having more idiopathic allergy reactions (last 2 weeks ago; needed epi pen and benadryl at night; no clear trigger). Saw allergist --> now on claritin, zantac  and singulair. In terms of neuropathy, lyrica was helping, but now he is wondering if lyrica may be contributing to allergic reactions. Has tried elimination diets without success.  ?  ?UPDATE 05/09/16: Since last visit, had 4th opinion at [facility], no clear diagnosis. Patient had repeat EMG nerve conduction study which demonstrated mild evidence of peripheral polyneuropathy. Also found to have mild right carpal tunnel syndrome. Autonomic and small fiber neuropathy testing including QSART response shows mild impairment of adrenergic and cardio vagal responses however postganglionic pseudomotor responses were normal. Additional lab testing including sedimentation rate, protein electrophoresis, hemoglobin A1c, paraneoplastic panel, autoimmune antibody testing, were all negative. Patient was diagnosed with idiopathic small and large fiber neuropathy and advised to follow-up with local neurology for symptom control. Still with burning pain in feet and low back. Tolerating Lyrica 200 mg 3 times a day. ?  ?UPDATE 01/25/16: Since last visit patient continues to have symptoms. Had second opinion with [facility]. Records reviewed and summarized: suspected small fiber; EMG/NCS was repeated at [facility] but results not viewable in system. Patient says that very mild abnl found, but not enough to explain degree of pain. ?  ?UPDATE 11/09/15: Since last visit, having more pain/burning in feet. 2 nights ago, had sudden increase in pain and sxs. Also working with allergist re: food allergies / sensitivities. Also with 2 ER visits for poss allergic reactions. Also with hand abnl sensations (cold).  ?  ?PRIOR HPI (09/28/15):  46 year old male here for evaluation of numbness and tingling feet. For past 2 years patient has had mild tingling in bilateral toes and feet. Over past 2 months this has suddenly increased burning sensation. Also he had noticed some weakness in his hands. Patient went outside neurologist who checked EMG nerve conduction study  which apparently showed neuropathy. He had some blood testing done which apparently were unremarkable. He was treated for carpal tunnel syndrome with hand splints and treated with epidural steroid injection for suspected lumbar radiculopathy. Patient requests a second opinion with me for further evaluation. Patient has been on Lyrica 100 mg twice a day with mild relief of painful symptoms. 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 He has remote history of lumbar radiculopathy radiating to the right leg status post discectomy in 1996. ? ? ?REVIEW OF SYSTEMS: Full 14 system review of systems performed and negative with exception of: as per hpi. ? ?ALLERGIES: ?Allergies  ?Allergen Reactions  ? Almond (Diagnostic) Swelling  ?  Swelling of throat  ? Carrot [Daucus Carota] Shortness Of Breath  ?  Can eat cooked carrots   ? Other Swelling  ?  Green peppers, swelling of throat, unsure of this allergy  ? Duloxetine Other (See Comments)  ?  Other, throat swelling, tightening of inner body  ? Gabapentin Other (See Comments)  ?  Other, throat swelling, tightness of inner body  ? ? ?HOME MEDICATIONS: ?Outpatient Medications Prior to Visit  ?Medication Sig Dispense Refill  ? sildenafil (VIAGRA) 50 MG tablet Take 50 mg by mouth as needed.    ? ?No facility-administered medications prior to visit.  ? ? ? ? ?PHYSICAL EXAM ? ?GENERAL EXAM/CONSTITUTIONAL: ?Vitals:  ?Vitals:  ? 02/17/22 0823  ?BP: (!) 99/59  ?Pulse: 62  ?Weight: 188 lb 12.8 oz (85.6 kg)  ?Height: '6\' 5"'$  (1.956 m)  ? ?Body mass index is 22.39 kg/m?. ?Wt Readings from Last 3 Encounters:  ?02/17/22 188 lb 12.8 oz (85.6 kg)  ?02/14/22 195 lb (88.5 kg)  ?01/22/18 231 lb 9.6 oz (105.1 kg)  ? ?Patient is in no distress; well developed, nourished and groomed; neck is supple ? ?CARDIOVASCULAR: ?Examination of carotid arteries is normal; no carotid bruits ?Regular rate and rhythm, no  murmurs ?Examination of peripheral vascular system by observation and palpation is normal ? ?EYES: ?Ophthalmoscopic exam of optic discs and posterior segments is normal; no papilledema or hemorrhages ?No results found. ? ?MUSCULOSKELETAL: ?Gait, strength, tone, movements noted in Neurologic exam below ? ?NEUROLOGIC: ?MENTAL STATUS:  ?   ? View : No data to display.  ?  ?  ?  ? ?awake, alert, oriented to person, place and time ?recent and remote memory intact ?normal attention and concentration ?language fluent, comprehension intact, naming intact ?fund of knowledge appropriate ? ?CRANIAL NERVE:  ?2nd - no papilledema on fundoscopic exam ?2nd, 3rd, 4th, 6th - pupils equal and reactive to light, visual fields full to confrontation, extraocular muscles intact, no nystagmus ?5th - facial sensation symmetric ?7th - facial strength symmetric ?8th - hearing intact ?9th - palate elevates symmetrically, uvula midline ?11th - shoulder shrug symmetric ?12th - tongue protrusion midline ? ?MOTOR:  ?normal bulk and tone, full strength in the BUE, BLE ? ?SENSORY:  ?normal and symmetric to light touch, temperature, vibration ? ?COORDINATION:  ?finger-nose-finger, fine finger movements normal ? ?REFLEXES:  ?deep tendon reflexes TRACE and symmetric ? ?GAIT/STATION:  ?narrow based  gait ? ? ? ? ?DIAGNOSTIC DATA (LABS, IMAGING, TESTING) ?- I reviewed patient records, labs, notes, testing and imaging myself where available. ? ?Lab Results  ?Component Value Date  ? WBC 5.8 11/22/2017  ? HGB 15.3 11/22/2017  ? HCT 44.6 11/22/2017  ? MCV 94 11/22/2017  ? PLT 265 11/22/2017  ? ?   ?Component Value Date/Time  ? NA 139 11/22/2017 0938  ? K 4.2 11/22/2017 0938  ? CL 98 11/22/2017 0938  ? CO2 25 11/22/2017 0938  ? GLUCOSE 72 11/22/2017 0938  ? GLUCOSE 89 03/17/2016 2316  ? BUN 21 11/22/2017 0938  ? CREATININE 1.37 (H) 11/22/2017 9381  ? CREATININE 1.15 10/23/2012 2106  ? CALCIUM 9.5 11/22/2017 0938  ? PROT 7.0 11/22/2017 0938  ? ALBUMIN 4.2  11/22/2017 0938  ? AST 24 11/22/2017 0938  ? ALT 48 (H) 11/22/2017 8299  ? ALKPHOS 75 11/22/2017 0938  ? BILITOT 0.4 11/22/2017 0938  ? GFRNONAA 63 11/22/2017 0938  ? GFRAA 73 11/22/2017 0938  ? ?Lab Results  ?[place]

## 2022-03-07 ENCOUNTER — Encounter: Payer: Self-pay | Admitting: Gastroenterology

## 2022-03-10 ENCOUNTER — Encounter: Payer: Self-pay | Admitting: Gastroenterology

## 2022-03-10 ENCOUNTER — Ambulatory Visit (AMBULATORY_SURGERY_CENTER): Payer: 59 | Admitting: Gastroenterology

## 2022-03-10 VITALS — BP 106/64 | HR 52 | Temp 97.5°F | Resp 10 | Ht 78.0 in | Wt 188.0 lb

## 2022-03-10 DIAGNOSIS — K640 First degree hemorrhoids: Secondary | ICD-10-CM

## 2022-03-10 DIAGNOSIS — K635 Polyp of colon: Secondary | ICD-10-CM

## 2022-03-10 DIAGNOSIS — K5730 Diverticulosis of large intestine without perforation or abscess without bleeding: Secondary | ICD-10-CM

## 2022-03-10 DIAGNOSIS — K6389 Other specified diseases of intestine: Secondary | ICD-10-CM

## 2022-03-10 DIAGNOSIS — Z1211 Encounter for screening for malignant neoplasm of colon: Secondary | ICD-10-CM | POA: Diagnosis present

## 2022-03-10 DIAGNOSIS — D124 Benign neoplasm of descending colon: Secondary | ICD-10-CM

## 2022-03-10 MED ORDER — SODIUM CHLORIDE 0.9 % IV SOLN: 500.0000 mL | Freq: Once | INTRAVENOUS | Status: AC

## 2022-03-10 NOTE — Op Note (Signed)
[place] ?Patient Name: Mark Morton ?Procedure Date: 03/10/2022 10:53 AM ?MRN: [id number] ?Endoscopist: Gerrit Heck , MD ?Age: 46 ?Referring MD:  ?Date of Birth: 06-12-1976 ?Gender: Male ?Account #: [id number] ?Procedure:                Colonoscopy ?Indications:              Screening for colorectal malignant neoplasm, This  ?                          is the patient's first colonoscopy ?Medicines:                Monitored Anesthesia Care ?Procedure:                Pre-Anesthesia Assessment: ?                          - Prior to the procedure, a History and Physical  ?                          was performed, and patient medications and  ?                          allergies were reviewed. The patient's tolerance of  ?                          previous anesthesia was also reviewed. The risks  ?                          and benefits of the procedure and the sedation  ?                          options and risks were discussed with the patient.  ?                          All questions were answered, and informed consent  ?                          was obtained. Prior Anticoagulants: The patient has  ?                          taken no previous anticoagulant or antiplatelet  ?                          agents. ASA Grade Assessment: II - A patient with  ?                          mild systemic disease. After reviewing the risks  ?                          and benefits, the patient was deemed in  ?                          satisfactory condition to undergo the procedure. ?  After obtaining informed consent, the colonoscope  ?                          was passed under direct vision. Throughout the  ?                          procedure, the patient's blood pressure, pulse, and  ?                          oxygen saturations were monitored continuously. The  ?                          CF [id number] #[id number] was introduced through the anus  ?                          and advanced to the the  terminal ileum. The  ?                          colonoscopy was performed without difficulty. The  ?                          patient tolerated the procedure well. The quality  ?                          of the bowel preparation was good. The terminal  ?                          ileum, ileocecal valve, appendiceal orifice, and  ?                          rectum were photographed. ?Scope In: 10:59:28 AM ?Scope Out: 11:23:24 AM ?Scope Withdrawal Time: 0 hours 19 minutes 55 seconds  ?Total Procedure Duration: 0 hours 23 minutes 56 seconds  ?Findings:                 The perianal and digital rectal examinations were  ?                          normal. ?                          A 3 mm polyp was found in the descending colon. The  ?                          polyp was sessile. The polyp was removed with a  ?                          cold snare. Resection and retrieval were complete.  ?                          Estimated blood loss was minimal. ?                          Multiple small and large-mouthed diverticula were  ?  found in the sigmoid colon, descending colon and  ?                          transverse colon. ?                          Non-bleeding internal hemorrhoids were found during  ?                          retroflexion. The hemorrhoids were small. ?                          The terminal ileum appeared normal. ?Complications:            No immediate complications. ?Estimated Blood Loss:     Estimated blood loss was minimal. ?Impression:               - One 3 mm polyp in the descending colon, removed  ?                          with a cold snare. Resected and retrieved. ?                          - Diverticulosis in the sigmoid colon, in the  ?                          descending colon and in the transverse colon. ?                          - Non-bleeding internal hemorrhoids. ?                          - The examined portion of the ileum was normal. ?Recommendation:           -  Patient has a contact number available for  ?                          emergencies. The signs and symptoms of potential  ?                          delayed complications were discussed with the  ?                          patient. Return to normal activities tomorrow.  ?                          Written discharge instructions were provided to the  ?                          patient. ?                          - Resume previous diet. ?                          - Continue present medications. ?                          -  Await pathology results. ?                          - Repeat colonoscopy for surveillance based on  ?                          pathology results. ?                          - Return to GI office PRN. ?Gerrit Heck, MD ?03/10/2022 11:26:57 AM ?

## 2022-03-10 NOTE — Progress Notes (Signed)
? ?GASTROENTEROLOGY PROCEDURE H&P NOTE  ? ?Primary Care Physician: ?Vernie Shanks, MD ? ? ? ?Reason for Procedure:  Colon Cancer screening ? ?Plan:    Colonoscopy ? ?Patient is appropriate for endoscopic procedure(s) in the ambulatory ([place]) setting. ? ?The nature of the procedure, as well as the risks, benefits, and alternatives were carefully and thoroughly reviewed with the patient. Ample time for discussion and questions allowed. The patient understood, was satisfied, and agreed to proceed.  ? ? ? ?HPI: ?Mark Morton is a 46 y.o. male who presents for colonoscopy for routine Colon Cancer screening.  No active GI symptoms.  No known family history of colon cancer or related malignancy.  Patient is otherwise without complaints or active issues today. ? ?Past Medical History:  ?Diagnosis Date  ? Allergy   ? Anxiety   ? Chronic kidney disease   ? Kidney stone  ? CTS (carpal tunnel syndrome)   ? Depression   ? Hyperlipidemia   ? borderline  ? Neuromuscular disorder ([place])   ? neuropathy  ? Neuropathy   ? feet mostly-- legs, all over per  pt  ? Varicose veins of left lower extremity   ? ? ?Past Surgical History:  ?Procedure Laterality Date  ? CYSTOSCOPY WITH RETROGRADE PYELOGRAM, URETEROSCOPY AND STENT PLACEMENT Left 06/19/2015  ? Procedure: CYSTOSCOPY WITH RETROGRADE PYELOGRAM, LEFT URETEROSCOPY, STONE BASKETRY AND LEFT JJ STENT PLACEMENT;  Surgeon: Carolan Clines, MD;  Location: [facility];  Service: Urology;  Laterality: Left;  ? [place]  ? ? ?Prior to Admission medications   ?Medication Sig Start Date End Date Taking? Authorizing Provider  ?sildenafil (VIAGRA) 50 MG tablet Take 50 mg by mouth as needed. 12/20/21   [provider]  ? ? ?Current Outpatient Medications  ?Medication Sig Dispense Refill  ? sildenafil (VIAGRA) 50 MG tablet Take 50 mg by mouth as needed.    ? ?Current Facility-Administered Medications  ?Medication Dose Route Frequency Provider Last Rate Last Admin  ? 0.9 %   sodium chloride infusion  500 mL Intravenous Once Riad Wagley V, DO      ? ? ?Allergies as of 03/10/2022 - Review Complete 03/10/2022  ?Allergen Reaction Noted  ? Carrot [daucus carota] Shortness Of Breath 09/28/2015  ? Duloxetine Other (See Comments) 09/11/2016  ? Gabapentin Other (See Comments) 09/11/2016  ? ? ?Family History  ?Problem Relation Age of Onset  ? Neuropathy Father   ? Cancer Maternal Grandmother   ? Diabetes Maternal Grandfather   ? Hypertension Maternal Grandfather   ? Colon cancer Neg Hx   ? Colon polyps Neg Hx   ? Esophageal cancer Neg Hx   ? Rectal cancer Neg Hx   ? Stomach cancer Neg Hx   ? ? ?Social History  ? ?Socioeconomic History  ? Marital status: Married  ?  Spouse name: Mary  ? Number of children: 0  ? Years of education: 95  ? Highest education level: Not on file  ?Occupational History  ?  Comment: carpentry  ?Tobacco Use  ? Smoking status: Never  ? Smokeless tobacco: Never  ?Substance and Sexual Activity  ? Alcohol use: No  ?  Comment: occ  ? Drug use: No  ? Sexual activity: Yes  ?  Partners: Female  ?  Birth control/protection: None  ?Other Topics Concern  ? Not on file  ?Social History Narrative  ? Right handed  ? Lives at home with wife  ? Caffeine use: rare  ? ?Social Determinants  of Health  ? ?Financial Resource Strain: Not on file  ?Food Insecurity: Not on file  ?Transportation Needs: Not on file  ?Physical Activity: Not on file  ?Stress: Not on file  ?Social Connections: Not on file  ?Intimate Partner Violence: Not on file  ? ? ?Physical Exam: ?Vital signs in last 24 hours: ?'@BP'$  107/65   Pulse 62   Temp (!) 97.5 ?F (36.4 ?C) (Temporal)   Ht '6\' 6"'$  (1.981 m)   Wt 188 lb (85.3 kg)   SpO2 98%   BMI 21.73 kg/m?  ?GEN: NAD ?EYE: Sclerae anicteric ?ENT: MMM ?CV: Non-tachycardic ?Pulm: CTA b/l ?GI: Soft, NT/ND ?NEURO:  Alert & Oriented x 3 ? ? ?Gerrit Heck, DO ?[facility] ? ? ?03/10/2022 10:53 AM ? ?

## 2022-03-10 NOTE — Patient Instructions (Signed)
Impression/Recommendations: ? ?Polyp, diverticulosis, and hemorrhoid handouts given to patient. ? ?Resume previous diet. ?Continue present medications. ?Await pathology results. ? ?Repeat colonoscopy for surveillance.  Date to be determined after pathology results. ? ?Return to GI office as needed. ? ?YOU HAD AN ENDOSCOPIC PROCEDURE TODAY AT [facility]:   Refer to the procedure report that was given to you for any specific questions about what was found during the examination.  If the procedure report does not answer your questions, please call your gastroenterologist to clarify.  If you requested that your care partner not be given the details of your procedure findings, then the procedure report has been included in a sealed envelope for you to review at your convenience later. ? ?YOU SHOULD EXPECT: Some feelings of bloating in the abdomen. Passage of more gas than usual.  Walking can help get rid of the air that was put into your GI tract during the procedure and reduce the bloating. If you had a lower endoscopy (such as a colonoscopy or flexible sigmoidoscopy) you may notice spotting of blood in your stool or on the toilet paper. If you underwent a bowel prep for your procedure, you may not have a normal bowel movement for a few days. ? ?Please Note:  You might notice some irritation and congestion in your nose or some drainage.  This is from the oxygen used during your procedure.  There is no need for concern and it should clear up in a day or so. ? ?SYMPTOMS TO REPORT IMMEDIATELY: ? ?Following lower endoscopy (colonoscopy or flexible sigmoidoscopy): ? Excessive amounts of blood in the stool ? Significant tenderness or worsening of abdominal pains ? Swelling of the abdomen that is new, acute ? Fever of 100?F or higher ? ?For urgent or emergent issues, a gastroenterologist can be reached at any hour by calling [phone]. ?Do not use MyChart messaging for urgent concerns.  ? ? ?DIET:  We do  recommend a small meal at first, but then you may proceed to your regular diet.  Drink plenty of fluids but you should avoid alcoholic beverages for 24 hours. ? ?ACTIVITY:  You should plan to take it easy for the rest of today and you should NOT DRIVE or use heavy machinery until tomorrow (because of the sedation medicines used during the test).   ? ?FOLLOW UP: ?Our staff will call the number listed on your records 48-72 hours following your procedure to check on you and address any questions or concerns that you may have regarding the information given to you following your procedure. If we do not reach you, we will leave a message.  We will attempt to reach you two times.  During this call, we will ask if you have developed any symptoms of COVID 19. If you develop any symptoms (ie: fever, flu-like symptoms, shortness of breath, cough etc.) before then, please call [phone].  If you test positive for Covid 19 in the 2 weeks post procedure, please call and report this information to [place].   ? ?If any biopsies were taken you will be contacted by phone or by letter within the next 1-3 weeks.  Please call us at [phone] if you have not heard about the biopsies in 3 weeks.  ? ? ?SIGNATURES/CONFIDENTIALITY: ?You and/or your care partner have signed paperwork which will be entered into your electronic medical record.  These signatures attest to the fact that that the information above on your After Visit Summary has  been reviewed and is understood.  Full responsibility of the confidentiality of this discharge information lies with you and/or your care-partner.  ?

## 2022-03-10 NOTE — Progress Notes (Signed)
Called to room to assist during endoscopic procedure.  Patient ID and intended procedure confirmed with present staff. Received instructions for my participation in the procedure from the performing physician.  

## 2022-03-10 NOTE — Progress Notes (Signed)
Pt's states no medical or surgical changes since previsit or office visit. 

## 2022-03-10 NOTE — Progress Notes (Signed)
PT taken to PACU. Monitors in place. VSS. Report given to RN. 

## 2022-03-14 ENCOUNTER — Telehealth: Payer: Self-pay

## 2022-03-14 NOTE — Telephone Encounter (Signed)
?  Follow up Call- ? ? ?  03/10/2022  ? 10:33 AM  ?Call back number  ?Post procedure Call Back phone  # [phone]  ?Permission to leave phone message Yes  ?  ? ?Patient questions: ? ?Do you have a fever, pain , or abdominal swelling? No. ?Pain Score  0 * ? ?Have you tolerated food without any problems? Yes.   ? ?Have you been able to return to your normal activities? Yes.   ? ?Do you have any questions about your discharge instructions: ?Diet   No. ?Medications  No. ?Follow up visit  No. ? ?Do you have questions or concerns about your Care? No. ? ?Actions: ?* If pain score is 4 or above: ?No action needed, pain <4. ? ? ? ? ?

## 2022-03-15 ENCOUNTER — Encounter: Payer: Self-pay | Admitting: Gastroenterology

## 2022-07-06 ENCOUNTER — Encounter: Payer: Self-pay | Admitting: Behavioral Health

## 2022-07-06 ENCOUNTER — Ambulatory Visit (INDEPENDENT_AMBULATORY_CARE_PROVIDER_SITE_OTHER): Payer: 59 | Admitting: Behavioral Health

## 2022-07-06 VITALS — BP 107/68 | HR 54 | Ht 77.0 in | Wt 191.0 lb

## 2022-07-06 DIAGNOSIS — F330 Major depressive disorder, recurrent, mild: Secondary | ICD-10-CM | POA: Diagnosis not present

## 2022-07-06 DIAGNOSIS — F411 Generalized anxiety disorder: Secondary | ICD-10-CM | POA: Diagnosis not present

## 2022-07-06 NOTE — Progress Notes (Signed)
[facility] MD/PA/NP Initial Note  07/06/2022 11:39 AM Mark Morton  MRN:  [id number]  Chief Complaint:  Chief Complaint   Depression; Anxiety; Establish Care     HPI:   "Danny", 46 year old male presents to this office for initial visit and to establish care. Kasandra Knudsen is very reserved and flat. Has hard time articulating his source of anxiety but believes that it is related to physiological reasons.  Says that he wished his wife was here to help explain. 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  Says that he has dealt with neuropathic pain in lower extremities and was on Lyrica for many years.  Says that he woke up 1 night feeling like his tongue and throat was swollen, which led to increased anxiety.  Says his wife thinks the anxiety causes the physical symptoms instead of the other way around. His PHQ-9 was negative and GAD-7 was 4. He is not sure he even needs to take medications at this point but would like wife to come in for follow up visit.  He denies any history of mania, psychosis.  No history of auditory or visual hallucinations or delirium. He denies SI or HI.   Previous psychotropic medication trials Cymbalta-Taken for pain    Visit Diagnosis: No diagnosis found.  Past Psychiatric History: none  Past Medical History:  Past Medical History:  Diagnosis Date   Allergy    Anxiety    Chronic kidney disease    Kidney stone   CTS (carpal tunnel syndrome)    Depression    Hyperlipidemia    borderline   Neuromuscular disorder (HCC)    neuropathy   Neuropathy    feet mostly-- legs, all over per  pt   Varicose veins of left lower extremity     Past Surgical History:  Procedure Laterality Date   CYSTOSCOPY WITH RETROGRADE PYELOGRAM, URETEROSCOPY AND STENT PLACEMENT Left 06/19/2015   Procedure: CYSTOSCOPY WITH RETROGRADE PYELOGRAM, LEFT URETEROSCOPY, STONE BASKETRY AND LEFT JJ STENT PLACEMENT;  Surgeon: Carolan Clines, MD;  Location: [facility];  Service: Urology;  Laterality: Left;   [place]    Family Psychiatric History: none reported  Family History:  Family History  Problem Relation Age of Onset   Neuropathy Father    Cancer Maternal Grandmother    Diabetes Maternal Grandfather    Hypertension Maternal Grandfather    Colon cancer Neg Hx    Colon polyps Neg Hx    Esophageal cancer Neg Hx    Rectal cancer Neg Hx    Stomach cancer Neg Hx     Social History:  Social History   Socioeconomic History   Marital status: Married    Spouse name: [place]   Number of children: 2   Years of education: 16   Highest education level: Bachelor's degree (e.g., BA, AB, BS)  Occupational History    Comment: carpentry  Tobacco Use   Smoking status: Never   Smokeless tobacco: Never  Vaping Use   Vaping Use: Former  Substance and Sexual Activity   Alcohol use: No    Comment: occ   Drug use: No   Sexual activity: Yes    Partners: Female    Birth control/protection: None  Other Topics Concern   Not on file  Social History Narrative   Right handed   Lives at home with wife and two children ages, 68 and 2.    Caffeine use: rare  Social Determinants of Health   Financial Resource Strain: Not on file  Food Insecurity: Not on file  Transportation Needs: Not on file  Physical Activity: Not on file  Stress: Not on file  Social Connections: Not on file    Allergies:  Allergies  Allergen Reactions   Carrot [Daucus Carota] Shortness Of Breath    Can eat cooked carrots    Duloxetine Other (See Comments)    Other, throat swelling, tightening of inner body   Gabapentin Other (See Comments)    Other, throat swelling, tightness of inner body    Metabolic Disorder Labs: No results found for: "HGBA1C", "MPG" No results found for: "PROLACTIN" Lab Results  Component Value Date   CHOL 219 (H) 10/23/2012   TRIG 658 (H) 10/23/2012   HDL 26 (L) 10/23/2012   CHOLHDL 8.4  10/23/2012   VLDL NOT CALC 10/23/2012   [place]  10/23/2012     Comment:       Not calculated due to Triglyceride >400. Suggest ordering Direct LDL (Unit Code: [phone]).   Total Cholesterol/HDL Ratio:CHD Risk                        Coronary Heart Disease Risk Table                                        Men       Women          1/2 Average Risk              3.4        3.3              Average Risk              5.0        4.4           2X Average Risk              9.6        7.1           3X Average Risk             23.4       11.0 Use the calculated Patient Ratio above and the CHD Risk table  to determine the patient's CHD Risk. ATP III Classification (LDL):       < 100        mg/dL         Optimal      100 - 129     mg/dL         Near or Above Optimal      130 - 159     mg/dL         Borderline High      160 - 189     mg/dL         High       > 190        mg/dL         Very High     Lab Results  Component Value Date   TSH 0.695 09/28/2015   TSH 1.506 06/22/2013    Therapeutic Level Labs: No results found for: "LITHIUM" No results found for: "VALPROATE" No results found for: "CBMZ"  Current Medications: Current Outpatient Medications  Medication Sig Dispense Refill   sildenafil (VIAGRA) 50  MG tablet Take 50 mg by mouth as needed.     Current Facility-Administered Medications  Medication Dose Route Frequency Provider Last Rate Last Admin   0.9 %  sodium chloride infusion  500 mL Intravenous Once Cirigliano, Vito V, DO        Medication Side Effects: none  Orders placed this visit:  No orders of the defined types were placed in this encounter.   Psychiatric Specialty Exam:  Review of Systems  Genitourinary:  Negative for scrotal swelling.  Allergic/Immunologic: Negative for immunocompromised state.    There were no vitals taken for this visit.There is no height or weight on file to calculate BMI.  General Appearance: Casual, Neat, and Well Groomed  Eye Contact:   Good  Speech:  Clear and Coherent  Volume:  Normal  Mood:  Anxious  Affect:  Flat and Anxious  Thought Process:  Coherent  Orientation:  Full (Time, Place, and Person)  Thought Content: Logical   Suicidal Thoughts:  No  Homicidal Thoughts:  No  Memory:  WNL  Judgement:  Good  Insight:  Good  Psychomotor Activity:  Normal  Concentration:  Concentration: Good  Recall:  Good  Fund of Knowledge: Good  Language: Good  Assets:  Desire for Improvement  ADL's:  Intact  Cognition: WNL  Prognosis:  Good   Screenings:  GAD-7    Flowsheet Row Office Visit from 07/06/2022 in [facility]  Total GAD-7 Score 4      PHQ2-9    [place] Office Visit from 07/06/2022 in [facility]  PHQ-2 Total Score 1       Receiving Psychotherapy: No   Treatment Plan/Recommendations:   Greater than 50% of 60 min face to face time with patient was spent on counseling and coordination of care. We discussed his concerns about anxiety and physical problems that may be the source of anxiety. He feels he does not necessarily need any medication to treat his mild anxiety or depression. His wife encouraged him to come to appointment and he wishes that he had her here. We discussed possible medication options and alternatives. He would like to think about it our discussion and review with his wife before making any decisions. Says he will call if he decides on AD. He would like to bring wife in for next f/u.   We agreed today to consider Lexapro 10 mg daily but he did not want RX today. He will f/u in 4 weeks and bring spouse with him. Will call if experiencing worsening symptoms Provided emergency contact information Reviewed PDMP   Elwanda Brooklyn, NP

## 2022-07-26 DIAGNOSIS — R69 Illness, unspecified: Secondary | ICD-10-CM | POA: Diagnosis not present

## 2022-07-28 DIAGNOSIS — E785 Hyperlipidemia, unspecified: Secondary | ICD-10-CM | POA: Diagnosis not present

## 2022-08-03 ENCOUNTER — Ambulatory Visit: Payer: 59 | Admitting: Behavioral Health

## 2022-08-07 DIAGNOSIS — R69 Illness, unspecified: Secondary | ICD-10-CM | POA: Diagnosis not present
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
## 2022-08-22 DIAGNOSIS — R69 Illness, unspecified: Secondary | ICD-10-CM | POA: Diagnosis not present

## 2022-08-29 ENCOUNTER — Encounter: Payer: Self-pay | Admitting: Rheumatology

## 2022-08-29 ENCOUNTER — Ambulatory Visit: Payer: 59 | Attending: Rheumatology | Admitting: Rheumatology

## 2022-08-29 ENCOUNTER — Telehealth: Payer: Self-pay

## 2022-08-29 VITALS — BP 96/64 | HR 59 | Resp 17 | Ht 77.0 in | Wt 188.2 lb

## 2022-08-29 DIAGNOSIS — M79672 Pain in left foot: Secondary | ICD-10-CM

## 2022-08-29 DIAGNOSIS — M5136 Other intervertebral disc degeneration, lumbar region: Secondary | ICD-10-CM

## 2022-08-29 DIAGNOSIS — M79671 Pain in right foot: Secondary | ICD-10-CM | POA: Diagnosis not present

## 2022-08-29 DIAGNOSIS — G609 Hereditary and idiopathic neuropathy, unspecified: Secondary | ICD-10-CM

## 2022-08-29 DIAGNOSIS — Q6671 Congenital pes cavus, right foot: Secondary | ICD-10-CM | POA: Diagnosis not present

## 2022-08-29 DIAGNOSIS — M19042 Primary osteoarthritis, left hand: Secondary | ICD-10-CM | POA: Diagnosis not present

## 2022-08-29 DIAGNOSIS — Q6672 Congenital pes cavus, left foot: Secondary | ICD-10-CM

## 2022-08-29 DIAGNOSIS — M19041 Primary osteoarthritis, right hand: Secondary | ICD-10-CM | POA: Diagnosis not present

## 2022-08-29 NOTE — Telephone Encounter (Signed)
Per Dr. Estanislado Pandy, if labs are normal, no follow up is needed. She cancelled new patient follow up. Thanks!

## 2022-08-31 LAB — RHEUMATOID FACTOR: Rheumatoid fact SerPl-aCnc: <14 IU/mL (ref <14)

## 2022-08-31 LAB — URIC ACID: Uric Acid, Serum: 5.4 mg/dL (ref 4.0–8.0)

## 2022-08-31 LAB — SEDIMENTATION RATE: Sed Rate: 2 mm/h (ref 0–15)

## 2022-08-31 LAB — CYCLIC CITRUL PEPTIDE ANTIBODY, IGG: Cyclic Citrullin Peptide Ab: <16 UNITS

## 2022-08-31 NOTE — Progress Notes (Signed)
All the labs are within normal limits.  I will discuss results at the follow-up visit.

## 2022-08-31 NOTE — Progress Notes (Signed)
Patient may return on a as needed basis or if he develops any new symptoms.  Please forward lab results to Dr. Paulla Dolly.

## 2022-09-01 NOTE — Telephone Encounter (Signed)
Patient advised of lab results

## 2022-09-07 DIAGNOSIS — R69 Illness, unspecified: Secondary | ICD-10-CM | POA: Diagnosis not present

## 2022-09-21 DIAGNOSIS — R69 Illness, unspecified: Secondary | ICD-10-CM | POA: Diagnosis not present

## 2022-10-05 DIAGNOSIS — R69 Illness, unspecified: Secondary | ICD-10-CM | POA: Diagnosis not present

## 2022-10-19 DIAGNOSIS — R69 Illness, unspecified: Secondary | ICD-10-CM | POA: Diagnosis not present

## 2022-11-02 DIAGNOSIS — R69 Illness, unspecified: Secondary | ICD-10-CM | POA: Diagnosis not present

## 2023-10-25 ENCOUNTER — Telehealth: Payer: Self-pay

## 2023-10-25 DIAGNOSIS — G629 Polyneuropathy, unspecified: Secondary | ICD-10-CM

## 2023-10-25 DIAGNOSIS — M79671 Pain in right foot: Secondary | ICD-10-CM

## 2023-10-25 NOTE — Telephone Encounter (Signed)
I spoke with Dr. Marjory Lies. He is agreeable to placing this referral.

## 2023-10-25 NOTE — Telephone Encounter (Signed)
Patients wife called in, she stated that her husbands insurance is now out of network with our office. She is asking if we can send a referral to an in network neurologist for them.   She is wanting the referral sent to [facility] Fax # [phone]

## 2023-11-01 ENCOUNTER — Telehealth: Payer: Self-pay | Admitting: Diagnostic Neuroimaging

## 2023-11-01 NOTE — Telephone Encounter (Signed)
Contacted [facility] at [phone] to make them aware of the urgent referral faxed over today. Receptionist said once referral is entered in his chart will call  patient to schedule appt and update insurance and demographics.

## 2023-11-01 NOTE — Telephone Encounter (Signed)
Wife called to check on status of referral

## 2023-11-01 NOTE — Telephone Encounter (Signed)
Referral for neurology fax to [facility]. Phone: [phone], Fax: [phone]

## 2023-11-01 NOTE — Telephone Encounter (Signed)
Patient's wife was transferred to new patient referrals by mistake. Stated this referral was supposed to be sent a week ago and patient is in tense pain and the sole provider for their family and this really needs to be done today. I apologized profusely for the referral not having been sent yet and let her know I would make sure this is done today for this patient. I checked in with Pattricia Boss to see if referral has been faxed as there is no phone note entered about it being sent but received no reply. Please advise what I can do to help get this done for patient today, needs done ASAP

## 2023-11-01 NOTE — Telephone Encounter (Signed)
Referral is in correctly will make sure Pattricia Boss will send as urgent

## 2023-11-01 NOTE — Telephone Encounter (Signed)
Called patient's wife back and let her know referral has been sent and labeled as urgent. She was appreciative of the call, let her know to c/b if she doesn't hear back from them within a week

## 2023-11-01 NOTE — Telephone Encounter (Addendum)
Contacted [facility] at [phone] to make them aware of the urgent referral faxed over today. Receptionist said once referral is entered in his chart will call  patient to schedule appt and update insurance and demographics.
